# Patient Record
Sex: Male | Born: 1990 | Race: Black or African American | Hispanic: No | Marital: Single | Smoking: Current every day smoker
Health system: Southern US, Community
[De-identification: ages and names within clinical notes are randomized; demographics above are authoritative.]

## PROBLEM LIST (undated history)

## (undated) DIAGNOSIS — F17200 Nicotine dependence, unspecified, uncomplicated: Secondary | ICD-10-CM

## (undated) HISTORY — PX: EYE SURGERY: SHX253

---
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
## 2011-01-10 ENCOUNTER — Ambulatory Visit: Payer: Medicaid Other | Admitting: Family Medicine

## 2011-01-15 ENCOUNTER — Encounter (HOSPITAL_COMMUNITY): Payer: Self-pay | Admitting: Emergency Medicine

## 2011-01-15 ENCOUNTER — Emergency Department (INDEPENDENT_AMBULATORY_CARE_PROVIDER_SITE_OTHER)
Admission: EM | Admit: 2011-01-15 | Discharge: 2011-01-15 | Disposition: A | Discharge status: Home / Self Care | Payer: Self-pay | Source: Home / Self Care | Attending: Family Medicine | Admitting: Family Medicine

## 2011-01-15 DIAGNOSIS — L0231 Cutaneous abscess of buttock: Secondary | ICD-10-CM

## 2011-01-15 DIAGNOSIS — L03317 Cellulitis of buttock: Secondary | ICD-10-CM

## 2011-01-15 LAB — CULTURE, ROUTINE-ABSCESS

## 2011-01-15 MED ORDER — DOXYCYCLINE HYCLATE 100 MG PO CAPS: 100.0000 mg | ORAL_CAPSULE | Freq: Two times a day (BID) | ORAL | Status: AC

## 2011-01-15 MED ORDER — IBUPROFEN 600 MG PO TABS: 600.0000 mg | ORAL_TABLET | Freq: Three times a day (TID) | ORAL | Status: AC | PRN

## 2011-01-15 NOTE — ED Provider Notes (Signed)
History     CSN: [id number]  Arrival date & time 01/15/11  1633   First MD Initiated Contact with Patient 01/15/11 1710      Chief Complaint  Patient presents with  . Recurrent Skin Infections    (Consider location/radiation/quality/duration/timing/severity/associated sxs/prior treatment) HPI Comments: 21 y/o male h/o asthma here c/o "boil" in right buttock for 3 days. Area tender and swollen no spontaneous drainage.   Past Medical History  Diagnosis Date  . Asthma     History reviewed. No pertinent past surgical history.  No family history on file.  History  Substance Use Topics  . Smoking status: Not on file  . Smokeless tobacco: Not on file  . Alcohol Use:       Review of Systems  Constitutional: Negative for fever and chills.  All other systems reviewed and are negative.    Allergies  Review of patient's allergies indicates not on file.  Home Medications   Current Outpatient Rx  Name Route Sig Dispense Refill  . ALBUTEROL SULFATE HFA 108 (90 BASE) MCG/ACT IN AERS Inhalation Inhale into the lungs. 2 puffs q4 prn     . CETIRIZINE HCL 10 MG PO TABS  10 mg. OTC 1 by mouth daily     . DOXYCYCLINE HYCLATE 100 MG PO CAPS Oral Take 1 capsule (100 mg total) by mouth 2 (two) times daily. 20 capsule 0  . FLUTICASONE-SALMETEROL 100-50 MCG/DOSE IN AEPB Inhalation Inhale 1 puff into the lungs 2 (two) times daily.      . IBUPROFEN 600 MG PO TABS Oral Take 1 tablet (600 mg total) by mouth every 8 (eight) hours as needed for pain. 20 tablet 0  . IPRATROPIUM BROMIDE 0.03 % NA SOLN  2 sprays in each nostril three times a day. do not use for more than 4 days       BP 135/90  Pulse 68  Temp(Src) 97.6 F (36.4 C) (Oral)  Resp 16  SpO2 100%  Physical Exam  Nursing note and vitals reviewed. Constitutional: He is oriented to person, place, and time. He appears well-developed and well-nourished. No distress.  Cardiovascular: Normal heart sounds.   Pulmonary/Chest:  Breath sounds normal.  Neurological: He is alert and oriented to person, place, and time.  Skin:       2x3 cm fluctuant tender mass in right lower gluteal area close to mid line. No spontaneous drainage. No significant cellulitis associated.     ED Course  INCISION AND DRAINAGE Date/Time: 01/15/2011 5:20 PM Performed by: Sharin Grave Authorized by: Sharin Grave Consent: Verbal consent obtained. Consent given by: patient Patient understanding: patient states understanding of the procedure being performed Type: abscess Location: right gluteus. Anesthesia: local infiltration Local anesthetic: lidocaine 1% with epinephrine Anesthetic total: 2 ml Scalpel size: 11 Incision type: elliptical Complexity: simple Drainage: purulent Drainage amount: copious Packing material: 1/4 in iodoform gauze Patient tolerance: Patient tolerated the procedure well with no immediate complications.   (including critical care time)   Labs Reviewed  CULTURE, ROUTINE-ABSCESS   No results found.   1. Abscess, gluteal, right       MDM  Treated with I&D plus doxycycline. Wound care instructions and follow up discussed and provided in witting.         Sharin Grave, MD 01/16/11 1159
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
## 2012-01-13 ENCOUNTER — Emergency Department (HOSPITAL_COMMUNITY): Payer: No Typology Code available for payment source

## 2012-01-13 ENCOUNTER — Encounter (HOSPITAL_COMMUNITY): Payer: Self-pay | Admitting: *Deleted

## 2012-01-13 ENCOUNTER — Emergency Department (HOSPITAL_COMMUNITY)
Admission: EM | Admit: 2012-01-13 | Discharge: 2012-01-13 | Disposition: A | Discharge status: Home / Self Care | Payer: No Typology Code available for payment source | Attending: Emergency Medicine | Admitting: Emergency Medicine

## 2012-01-13 DIAGNOSIS — S4980XA Other specified injuries of shoulder and upper arm, unspecified arm, initial encounter: Secondary | ICD-10-CM | POA: Insufficient documentation

## 2012-01-13 DIAGNOSIS — J45909 Unspecified asthma, uncomplicated: Secondary | ICD-10-CM | POA: Insufficient documentation

## 2012-01-13 DIAGNOSIS — S46909A Unspecified injury of unspecified muscle, fascia and tendon at shoulder and upper arm level, unspecified arm, initial encounter: Secondary | ICD-10-CM | POA: Insufficient documentation

## 2012-01-13 DIAGNOSIS — Y92410 Unspecified street and highway as the place of occurrence of the external cause: Secondary | ICD-10-CM | POA: Insufficient documentation

## 2012-01-13 DIAGNOSIS — IMO0002 Reserved for concepts with insufficient information to code with codable children: Secondary | ICD-10-CM | POA: Insufficient documentation

## 2012-01-13 DIAGNOSIS — F17200 Nicotine dependence, unspecified, uncomplicated: Secondary | ICD-10-CM | POA: Insufficient documentation

## 2012-01-13 DIAGNOSIS — Y93I9 Activity, other involving external motion: Secondary | ICD-10-CM | POA: Insufficient documentation

## 2012-01-13 LAB — POCT I-STAT, CHEM 8
Calcium, Ion: 1.27 mmol/L — ABNORMAL HIGH (ref 1.12–1.23)
Creatinine, Ser: 0.9 mg/dL (ref 0.50–1.35)
Glucose, Bld: 90 mg/dL (ref 70–99)
Hemoglobin: 16 g/dL (ref 13.0–17.0)
Potassium: 4 mEq/L (ref 3.5–5.1)
TCO2: 29 mmol/L (ref 0–100)

## 2012-01-13 MED ORDER — MELOXICAM 15 MG PO TABS: 15.0000 mg | ORAL_TABLET | Freq: Every day | ORAL | Status: DC

## 2012-01-13 MED ORDER — IOHEXOL 300 MG/ML  SOLN
80.0000 mL | Freq: Once | INTRAMUSCULAR | Status: AC | PRN
  Administered 2012-01-13: 1 mL via INTRAVENOUS

## 2012-01-13 MED ORDER — METHOCARBAMOL 500 MG PO TABS: 500.0000 mg | ORAL_TABLET | Freq: Two times a day (BID) | ORAL | Status: DC

## 2012-01-13 MED ORDER — ONDANSETRON HCL 4 MG/2ML IJ SOLN
4.0000 mg | Freq: Once | INTRAMUSCULAR | Status: AC
  Administered 2012-01-13: 4 mg via INTRAVENOUS
  Filled 2012-01-13: qty 2

## 2012-01-13 MED ORDER — MORPHINE SULFATE 4 MG/ML IJ SOLN
4.0000 mg | Freq: Once | INTRAMUSCULAR | Status: AC
  Administered 2012-01-13: 4 mg via INTRAVENOUS
  Filled 2012-01-13: qty 1

## 2012-01-13 MED ORDER — OXYCODONE-ACETAMINOPHEN 5-325 MG PO TABS: 2.0000 | ORAL_TABLET | ORAL | Status: DC | PRN
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
## 2013-12-10 ENCOUNTER — Emergency Department (HOSPITAL_COMMUNITY): Payer: Self-pay

## 2013-12-10 ENCOUNTER — Encounter (HOSPITAL_COMMUNITY): Payer: Self-pay | Admitting: Emergency Medicine

## 2013-12-10 ENCOUNTER — Emergency Department (HOSPITAL_COMMUNITY)
Admission: EM | Admit: 2013-12-10 | Discharge: 2013-12-10 | Disposition: A | Discharge status: Home / Self Care | Payer: Self-pay | Attending: Emergency Medicine | Admitting: Emergency Medicine

## 2013-12-10 DIAGNOSIS — S79921A Unspecified injury of right thigh, initial encounter: Secondary | ICD-10-CM | POA: Insufficient documentation

## 2013-12-10 DIAGNOSIS — S199XXA Unspecified injury of neck, initial encounter: Secondary | ICD-10-CM | POA: Insufficient documentation

## 2013-12-10 DIAGNOSIS — Y92410 Unspecified street and highway as the place of occurrence of the external cause: Secondary | ICD-10-CM | POA: Insufficient documentation

## 2013-12-10 DIAGNOSIS — R52 Pain, unspecified: Secondary | ICD-10-CM

## 2013-12-10 DIAGNOSIS — M542 Cervicalgia: Secondary | ICD-10-CM

## 2013-12-10 DIAGNOSIS — J45909 Unspecified asthma, uncomplicated: Secondary | ICD-10-CM | POA: Insufficient documentation

## 2013-12-10 DIAGNOSIS — Y998 Other external cause status: Secondary | ICD-10-CM | POA: Insufficient documentation

## 2013-12-10 DIAGNOSIS — Z720 Tobacco use: Secondary | ICD-10-CM | POA: Insufficient documentation

## 2013-12-10 DIAGNOSIS — R4020 Unspecified coma: Secondary | ICD-10-CM | POA: Insufficient documentation

## 2013-12-10 DIAGNOSIS — M545 Low back pain, unspecified: Secondary | ICD-10-CM

## 2013-12-10 DIAGNOSIS — M79604 Pain in right leg: Secondary | ICD-10-CM

## 2013-12-10 DIAGNOSIS — S3992XA Unspecified injury of lower back, initial encounter: Secondary | ICD-10-CM | POA: Insufficient documentation

## 2013-12-10 DIAGNOSIS — Y9389 Activity, other specified: Secondary | ICD-10-CM | POA: Insufficient documentation

## 2013-12-10 DIAGNOSIS — Z791 Long term (current) use of non-steroidal anti-inflammatories (NSAID): Secondary | ICD-10-CM | POA: Insufficient documentation

## 2013-12-10 MED ORDER — NAPROXEN 500 MG PO TABS: 500.0000 mg | ORAL_TABLET | Freq: Two times a day (BID) | ORAL | Status: DC

## 2013-12-10 MED ORDER — METHOCARBAMOL 500 MG PO TABS: 500.0000 mg | ORAL_TABLET | Freq: Two times a day (BID) | ORAL | Status: DC | PRN

## 2013-12-10 MED ORDER — HYDROCODONE-ACETAMINOPHEN 5-325 MG PO TABS: 1.0000 | ORAL_TABLET | ORAL | Status: DC | PRN

## 2013-12-10 MED ORDER — HYDROMORPHONE HCL 1 MG/ML IJ SOLN
1.0000 mg | Freq: Once | INTRAMUSCULAR | Status: AC
  Administered 2013-12-10: 1 mg via INTRAVENOUS
  Filled 2013-12-10: qty 1
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
## 2013-12-10 NOTE — Discharge Instructions (Signed)
Back Exercises °Back exercises help treat and prevent back injuries. The goal of back exercises is to increase the strength of your abdominal and back muscles and the flexibility of your back. These exercises should be started when you no longer have back pain. Back exercises include: °· Pelvic Tilt. Lie on your back with your knees bent. Tilt your pelvis until the lower part of your back is against the floor. Hold this position 5 to 10 sec and repeat 5 to 10 times. °· Knee to Chest. Pull first 1 knee up against your chest and hold for 20 to 30 seconds, repeat this with the other knee, and then both knees. This may be done with the other leg straight or bent, whichever feels better. °· Sit-Ups or Curl-Ups. Bend your knees 90 degrees. Start with tilting your pelvis, and do a partial, slow sit-up, lifting your trunk only 30 to 45 degrees off the floor. Take at least 2 to 3 seconds for each sit-up. Do not do sit-ups with your knees out straight. If partial sit-ups are difficult, simply do the above but with only tightening your abdominal muscles and holding it as directed. °· Hip-Lift. Lie on your back with your knees flexed 90 degrees. Push down with your feet and shoulders as you raise your hips a couple inches off the floor; hold for 10 seconds, repeat 5 to 10 times. °· Back arches. Lie on your stomach, propping yourself up on bent elbows. Slowly press on your hands, causing an arch in your low back. Repeat 3 to 5 times. Any initial stiffness and discomfort should lessen with repetition over time. °· Shoulder-Lifts. Lie face down with arms beside your body. Keep hips and torso pressed to floor as you slowly lift your head and shoulders off the floor. °Do not overdo your exercises, especially in the beginning. Exercises may cause you some mild back discomfort which lasts for a few minutes; however, if the pain is more severe, or lasts for more than 15 minutes, do not continue exercises until you see your caregiver.  Improvement with exercise therapy for back problems is slow.  °See your caregivers for assistance with developing a proper back exercise program. °Document Released: 01/28/2004 Document Revised: 03/14/2011 Document Reviewed: 10/21/2010 °ExitCare® Patient Information ©2015 [employer]. This information is not intended to replace advice given to you by your health care provider. Make sure you discuss any questions you have with your health care provider. ° °Back Pain, Adult °Low back pain is very common. About 1 in 5 people have back pain. The cause of low back pain is rarely dangerous. The pain often gets better over time. About half of people with a sudden onset of back pain feel better in just 2 weeks. About 8 in 10 people feel better by 6 weeks.  °CAUSES °Some common causes of back pain include: °· Strain of the muscles or ligaments supporting the spine. °· Wear and tear (degeneration) of the spinal discs. °· Arthritis. °· Direct injury to the back. °DIAGNOSIS °Most of the time, the direct cause of low back pain is not known. However, back pain can be treated effectively even when the exact cause of the pain is unknown. Answering your caregiver's questions about your overall health and symptoms is one of the most accurate ways to make sure the cause of your pain is not dangerous. If your caregiver needs more information, he or she may order lab work or imaging tests (X-rays or MRIs). However, even if imaging tests show changes in your   back, this usually does not require surgery. °HOME CARE INSTRUCTIONS °For many people, back pain returns. Since low back pain is rarely dangerous, it is often a condition that people can learn to manage on their own.  °· Remain active. It is stressful on the back to sit or stand in one place. Do not sit, drive, or stand in one place for more than 30 minutes at a time. Take short walks on level surfaces as soon as pain allows. Try to increase the length of time you walk each  day. °· Do not stay in bed. Resting more than 1 or 2 days can delay your recovery. °· Do not avoid exercise or work. Your body is made to move. It is not dangerous to be active, even though your back may hurt. Your back will likely heal faster if you return to being active before your pain is gone. °· Pay attention to your body when you  bend and lift. Many people have less discomfort when lifting if they bend their knees, keep the load close to their bodies, and avoid twisting. Often, the most comfortable positions are those that put less stress on your recovering back. °· Find a comfortable position to sleep. Use a firm mattress and lie on your side with your knees slightly bent. If you lie on your back, put a pillow under your knees. °· Only take over-the-counter or prescription medicines as directed by your caregiver. Over-the-counter medicines to reduce pain and inflammation are often the most helpful. Your caregiver may prescribe muscle relaxant drugs. These medicines help dull your pain so you can more quickly return to your normal activities and healthy exercise. °· Put ice on the injured area. °· Put ice in a plastic bag. °· Place a towel between your skin and the bag. °· Leave the ice on for 15-20 minutes, 03-04 times a day for the first 2 to 3 days. After that, ice and heat may be alternated to reduce pain and spasms. °· Ask your caregiver about trying back exercises and gentle massage. This may be of some benefit. °· Avoid feeling anxious or stressed. Stress increases muscle tension and can worsen back pain. It is important to recognize when you are anxious or stressed and learn ways to manage it. Exercise is a great option. °SEEK MEDICAL CARE IF: °· You have pain that is not relieved with rest or medicine. °· You have pain that does not improve in 1 week. °· You have new symptoms. °· You are generally not feeling well. °SEEK IMMEDIATE MEDICAL CARE IF:  °· You have pain that radiates from your back into  your legs. °· You develop new bowel or bladder control problems. °· You have unusual weakness or numbness in your arms or legs. °· You develop nausea or vomiting. °· You develop abdominal pain. °· You feel faint. °Document Released: 12/20/2004 Document Revised: 06/21/2011 Document Reviewed: 04/23/2013 °ExitCare® Patient Information ©2015 [employer]. This information is not intended to replace advice given to you by your health care provider. Make sure you discuss any questions you have with your health care provider. °Cervical Sprain °A cervical sprain is an injury in the neck in which the strong, fibrous tissues (ligaments) that connect your neck bones stretch or tear. Cervical sprains can range from mild to severe. Severe cervical sprains can cause the neck vertebrae to be unstable. This can lead to damage of the spinal cord and can result in serious nervous system problems. The amount of time it takes for a cervical sprain to get better depends on the cause and extent   of the injury. Most cervical sprains heal in 1 to 3 weeks. °CAUSES  °Severe cervical sprains may be caused by:  °· Contact sport injuries (such as from football, rugby, wrestling, hockey, auto racing, gymnastics, diving, martial arts, or boxing).   °· Motor vehicle collisions.   °· Whiplash injuries. This is an injury from a sudden forward and backward whipping movement of the head and neck.  °· Falls.   °Mild cervical sprains may be caused by:  °· Being in an awkward position, such as while cradling a telephone between your ear and shoulder.   °· Sitting in a chair that does not offer proper support.   °· Working at a poorly designed computer station.   °· Looking up or down for long periods of time.   °SYMPTOMS  °· Pain, soreness, stiffness, or a burning sensation in the front, back, or sides of the neck. This discomfort may develop immediately after the injury or slowly, 24 hours or more after the injury.   °· Pain or tenderness directly in  the middle of the back of the neck.   °· Shoulder or upper back pain.   °· Limited ability to move the neck.   °· Headache.   °· Dizziness.   °· Weakness, numbness, or tingling in the hands or arms.   °· Muscle spasms.   °· Difficulty swallowing or chewing.   °· Tenderness and swelling of the neck.   °DIAGNOSIS  °Most of the time your health care provider can diagnose a cervical sprain by taking your history and doing a physical exam. Your health care provider will ask about previous neck injuries and any known neck problems, such as arthritis in the neck. X-rays may be taken to find out if there are any other problems, such as with the bones of the neck. Other tests, such as a CT scan or MRI, may also be needed.  °TREATMENT  °Treatment depends on the severity of the cervical sprain. Mild sprains can be treated with rest, keeping the neck in place (immobilization), and pain medicines. Severe cervical sprains are immediately immobilized. Further treatment is done to help with pain, muscle spasms, and other symptoms and may include: °· Medicines, such as pain relievers, numbing medicines, or muscle relaxants.   °· Physical therapy. This may involve stretching exercises, strengthening exercises, and posture training. Exercises and improved posture can help stabilize the neck, strengthen muscles, and help stop symptoms from returning.   °HOME CARE INSTRUCTIONS  °· Put ice on the injured area.   °¨ Put ice in a plastic bag.   °¨ Place a towel between your skin and the bag.   °¨ Leave the ice on for 15-20 minutes, 3-4 times a day.   °· If your injury was severe, you may have been given a cervical collar to wear. A cervical collar is a two-piece collar designed to keep your neck from moving while it heals. °¨ Do not remove the collar unless instructed by your health care provider. °¨ If you have long hair, keep it outside of the collar. °¨ Ask your health care provider before making any adjustments to your collar. Minor  adjustments may be required over time to improve comfort and reduce pressure on your chin or on the back of your head. °¨ If you are allowed to remove the collar for cleaning or bathing, follow your health care provider's instructions on how to do so safely. °¨ Keep your collar clean by wiping it with mild soap and water and drying it completely. If the collar you have been given includes removable pads, remove them every 1-2 days and hand   wash them with soap and water. Allow them to air dry. They should be completely dry before you wear them in the collar. °¨ If you are allowed to remove the collar for cleaning and bathing, wash and dry the skin of your neck. Check your skin for irritation or sores. If you see any, tell your health care provider. °¨ Do not drive while wearing the collar.   °· Only take over-the-counter or prescription medicines for pain, discomfort, or fever as directed by your health care provider.   °· Keep all follow-up appointments as directed by your health care provider.   °· Keep all physical therapy appointments as directed by your health care provider.   °· Make any needed adjustments to your workstation to promote good posture.   °· Avoid positions and activities that make your symptoms worse.   °· Warm up and stretch before being active to help prevent problems.   °SEEK MEDICAL CARE IF:  °· Your pain is not controlled with medicine.   °· You are unable to decrease your pain medicine over time as planned.   °· Your activity level is not improving as expected.   °SEEK IMMEDIATE MEDICAL CARE IF:  °· You develop any bleeding. °· You develop stomach upset. °· You have signs of an allergic reaction to your medicine.   °· Your symptoms get worse.   °· You develop new, unexplained symptoms.   °· You have numbness, tingling, weakness, or paralysis in any part of your body.   °MAKE SURE YOU:  °· Understand these instructions. °· Will watch your condition. °· Will get help right away if you are not  doing well or get worse. °Document Released: 10/17/2006 Document Revised: 12/25/2012 Document Reviewed: 06/27/2012 °ExitCare® Patient Information ©2015 [employer]. This information is not intended to replace advice given to you by your health care provider. Make sure you discuss any questions you have with your health care provider. ° ° °Emergency Department Resource Guide °1) Find a Doctor and Pay Out of Pocket °Although you won't have to find out who is covered by your insurance plan, it is a good idea to ask around and get recommendations. You will then need to call the office and see if the doctor you have chosen will accept you as a new patient and what types of options they offer for patients who are self-pay. Some doctors offer discounts or will set up payment plans for their patients who do not have insurance, but you will need to ask so you aren't surprised when you get to your appointment. ° °2) Contact Your Local Health Department °Not all health departments have doctors that can see patients for sick visits, but many do, so it is worth a call to see if yours does. If you don't know where your local health department is, you can check in your phone book. The CDC also has a tool to help you locate your state's health department, and many state websites also have listings of all of their local health departments. ° °3) Find a Walk-in Clinic °If your illness is not likely to be very severe or complicated, you may want to try a walk in clinic. These are popping up all over the country in pharmacies, drugstores, and shopping centers. They're usually staffed by nurse practitioners or physician assistants that have been trained to treat common illnesses and complaints. They're usually fairly quick and inexpensive. However, if you have serious medical issues or chronic medical problems, these are probably not your best option. ° °No Primary Care Doctor: °- Call Health   Connect at  [phone] - they can help you  locate a primary care doctor that  accepts your insurance, provides certain services, etc. °- Physician Referral Service- 1-800-533-3463 ° °Chronic Pain Problems: °Organization         Address  Phone   Notes  °[facility]  ([phone]) [phone] Patients need to be referred by their primary care doctor.  ° °Medication Assistance: °Organization         Address  Phone   Notes  °[place] [facility] [address] °[place], [place] [address] (336) 641-8030 --Must be a resident of [place] °-- Must have NO insurance coverage whatsoever (no Medicaid/ Medicare, etc.) °-- The pt. MUST have a primary care doctor that directs their care regularly and follows them in the community °  °[facility]  (866) 331-1348   °[employer]  (888) 892-1162   ° °Agencies that provide inexpensive medical care: °Organization         Address  Phone   Notes  °[facility]  (336) 832-8035   °[facility]    (336) 832-7272   °[facility] [address] °[place], [place] [address] (336) 832-4777   °[facility] [address], °[place] (336) 271-4999   °[facility]    (336) 373-0678   °[facility]    (336) 272-1050   °[facility] ° [address], [place] Phone:  (336) 832-4444, Fax:  (336) [phone] Hours of Operation:  9 am - 6 pm, M-F.  Also accepts Medicaid/Medicare and self-pay.  °[facility] ° [address], [place] Phone: (336) [phone], Fax: (336) [phone]. Hours of Operation:  8:30 am - 5:30 pm, M-F.  Also accepts Medicaid and self-pay.  °[facility] [place] [address], [place] Phone: (336) 878-6027   °[facility] [address], [place], [place] (336)723-1848, Ext. 123 Mondays & Thursdays: 7-9 AM.  First 15 patients are seen on a first come, first serve basis. °  ° °Medicaid-accepting [place]  Providers: ° °Organization         Address  Phone   Notes  °[facility] [address], [place] (336) [phone] Also accepts self-pay patients.  °[facility] [address], [place] ° (336) [phone]   °[facility] [address], [place] (336) 288-8857   °[facility] [address] [place] [address], [place] (336) 299-7000   °Veita Bland [address], [place]  ° (336) [phone] Only accepts [place] Access Medicaid patients after they have their name applied to their card.  ° °Self-Pay (no insurance) in [place]: ° °Organization         Address  Phone   Notes  °Sickle Cell Patients, [facility] [address], [place] (336) 832-1970   °[facility] [address], [place] (336) 832-4400   °[facility] ° [address], [place] (336) [phone]   °[facility]/Dr. Osei-Bonsu ° [address] [place] [address], [place] or [address], [place] (336) [phone] Phone number for both [place] and [place] locations is the same.  °[facility] [address], [place] (336) [phone]   °[facility] [address] [place] [address], [place] or [address] (336) 852-7530 °(336) 878-2260   °[facility] [address], [place] (336) 350-1642, phone; (336) 294-5005, fax Sees patients 1st and 3rd Saturday of every month.  Must not qualify for public or private insurance (i.e. Medicaid, Medicare, [place] Health Choice, Veterans' Benefits) • Household income should be no more than 200% of the poverty level •The clinic cannot treat you if you are pregnant or think you are pregnant • Sexually transmitted diseases are not treated at the clinic.  ° ° °Dental Care: °Organization         Address  Phone  Notes  °[facility] [address], [place] (336) 641-6152 Accepts children up to age 21 who are enrolled in Medicaid or [place] Health Choice; pregnant women with a Medicaid card; and children who have applied for Medicaid or [place] Health Choice, but were declined, whose parents can pay a reduced fee at time of service.  °[facility] [place]  [address], [place] (336) 641-7733 Accepts children up to age 21 who are enrolled in Medicaid or [place] Health Choice; pregnant women with a Medicaid card; and children who have applied for Medicaid or [place] Health Choice, but were declined, whose parents can pay a reduced fee at time of service.  °[facility] ° [address], [place] (336) [phone] Patients are seen by appointment only. Walk-ins are not accepted. [facility] will see patients 18 years of age and older. °Monday - Tuesday (8am-5pm) °Most Wednesdays (8:30-5pm) °$30 per visit, cash only  °[facility] ° [address], [place] (336) [phone] Patients are seen by appointment only. Walk-ins are not accepted. [facility] will see patients 18 years of age and older. °One Wednesday Evening (Monthly: Volunteer Based).  $30 per visit, cash only  °[facility]  ([phone]) [phone] for adults; Children under age 4, call Graduate Pediatric Dentistry at ([phone]) [phone]. Children aged 4-14, please call ([phone]) [phone] to request a pediatric application. ° Dental services are provided in all areas of dental care including fillings, crowns and bridges, complete and partial dentures, implants, gum treatment, root canals, and extractions. Preventive care is also provided. Treatment is provided to both adults and children. °Patients are selected via a lottery and there is often a waiting list. °  °[facility] [address], °[place] ° (336) 763-8833 [email or web address] °  °[facility]  [address], [place], [place] (336)723-1848, Ext. 123 Second and Fourth Thursday of each month, opens at 6:30 AM; Clinic ends at 9 AM.  Patients are seen on a first-come first-served basis, and a limited number are seen during each clinic.  ° °[facility] ° [address], [place], [place] (336) [phone]   Eligibility Requirements °You must have lived in [place], [place], or [place] counties for at least the last three months. °  You cannot be eligible for state or federal sponsored healthcare insurance, including Veterans Administration, Medicaid, or Medicare. °  You generally cannot be eligible for healthcare insurance through your employer.  °  How to apply: °Eligibility screenings are held every Tuesday and Wednesday afternoon from 1:00 pm until 4:00 pm. You do not need an appointment for the interview!  °[facility] [address], [place],  336-631-2330   °[facility]    336-342-8273   °[facility]  336-703-3100   °[facility]  336-570-6415   ° °Behavioral Health Resources in the Community: °Intensive Outpatient Programs °Organization         Address  Phone  Notes  °[facility] [address], [place], [place] 336-878-6098   °[facility] [address], [place], [place] 336-832-9800   °[facility] [address], [place], [place] ° 336-882-2125   °[facility] [address],  °[place], [place] 1-800-853-5163 or 336-641-4981   °Substance Abuse Resources °Organization         Address  Phone  Notes  °[facility]  336-882-2125   °[facility]  336-784-9470   °[facility]  336-285-9073   °[facility]  336-845-3988   °Residential & Outpatient Substance Abuse Program  1-800-659-3381   °Psychological Services °Organization         Address  Phone  Notes  °[facility]  336- 832-9600     °[facility]  336- 378-7881   °[facility] [address], [place] 1-800-853-5163 or 336-641-4981   ° °Mobile Crisis Teams °Organization         Address  Phone  Notes  °[facility], Mobile Crisis Care Unit  1-877-626-1772   °Assertive °[facility] ° [address] [place], [place] 336-834-9664   °Sharon DeEsch [address] °[place] 336-554-5454   ° °Self-Help/Support Groups °Organization         Address  Phone             Notes  °[facility] - variety of support groups  336- 373-1402 Call for more information  °Narcotics Anonymous (NA), [facility] [address], °[place]  2 meetings at this location  ° °Residential Treatment Programs °Organization         Address  Phone  Notes  °[facility] [address],    °[place]  1-866-801-8205   °[facility] ° [address], [place], [place] 704-293-8524   °[facility] [address], [place] 336-845-3988 Admissions: 8am-3pm M-F  °[facility] [address]    °[place], [place] 336-841-1104   °[facility] [address], [place], [place] 336-379-7146   °[facility] [address]  °[place], [place] 336-285-9073   °[facility] - Intensive Outpatient [address], [place], [place] 336-852-3033   °[facility] [address]  °[place], [place] 1-877-615-2722 or 336-784-9470   °[facility] [address] [place], [place] 336-227-7417 Accepts Medicaid  °[facility] [address]  ° [place] 1-800-659-3381 Substance Abuse/Addiction Treatment  ° °[place] [facility] Resources °Organization         Address  Phone  Notes  °[facility]  (888) 581-9988   °Julie Brannon, PhD [address] [place], [place]   (336) 349-5553 or (336) 951-0000    °[facility]   [address] °[place], [place] (336) 349-4454   °[facility] [address], [place], [place] (336) 342-8316 Insurance/Medicaid/sponsorship through [facility]  °[facility] [address]                                      [place], [place] (336) [phone] Therapy/tele-psych/case  °[facility] [address]  ° [place], [place] (336) [phone]    °Dr. Arfeen  [phone]   °[facility]  [employer] [facility] 1) [address], [place] °2) [address], [place] °3)  [address], [place] [phone] °[phone] ° °[phone]   °[place] Child Abuse Hotline [phone] or [phone] (After Hours)    ° ° ° ° °

## 2013-12-10 NOTE — ED Notes (Signed)
Patient transported to X-ray 
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
## 2015-09-18 ENCOUNTER — Encounter (HOSPITAL_COMMUNITY): Payer: Self-pay | Admitting: Emergency Medicine

## 2015-09-18 ENCOUNTER — Emergency Department (HOSPITAL_COMMUNITY): Payer: Self-pay

## 2015-09-18 ENCOUNTER — Emergency Department (HOSPITAL_COMMUNITY)
Admission: EM | Admit: 2015-09-18 | Discharge: 2015-09-18 | Disposition: A | Discharge status: Home / Self Care | Payer: Self-pay | Attending: Emergency Medicine | Admitting: Emergency Medicine

## 2015-09-18 DIAGNOSIS — Y999 Unspecified external cause status: Secondary | ICD-10-CM | POA: Insufficient documentation

## 2015-09-18 DIAGNOSIS — S01111A Laceration without foreign body of right eyelid and periocular area, initial encounter: Secondary | ICD-10-CM | POA: Insufficient documentation

## 2015-09-18 DIAGNOSIS — Y92481 Parking lot as the place of occurrence of the external cause: Secondary | ICD-10-CM | POA: Insufficient documentation

## 2015-09-18 DIAGNOSIS — F17210 Nicotine dependence, cigarettes, uncomplicated: Secondary | ICD-10-CM | POA: Insufficient documentation

## 2015-09-18 DIAGNOSIS — Y939 Activity, unspecified: Secondary | ICD-10-CM | POA: Insufficient documentation

## 2015-09-18 DIAGNOSIS — J45909 Unspecified asthma, uncomplicated: Secondary | ICD-10-CM | POA: Insufficient documentation

## 2015-09-18 DIAGNOSIS — S0990XA Unspecified injury of head, initial encounter: Secondary | ICD-10-CM | POA: Insufficient documentation

## 2015-09-18 DIAGNOSIS — H539 Unspecified visual disturbance: Secondary | ICD-10-CM

## 2015-09-18 DIAGNOSIS — S0083XA Contusion of other part of head, initial encounter: Secondary | ICD-10-CM

## 2015-09-18 DIAGNOSIS — W228XXA Striking against or struck by other objects, initial encounter: Secondary | ICD-10-CM | POA: Insufficient documentation

## 2015-09-18 DIAGNOSIS — S0181XA Laceration without foreign body of other part of head, initial encounter: Secondary | ICD-10-CM | POA: Insufficient documentation

## 2015-09-18 LAB — BASIC METABOLIC PANEL
Anion gap: 8 (ref 5–15)
BUN: 12 mg/dL (ref 6–20)
CALCIUM: 9.4 mg/dL (ref 8.9–10.3)
CHLORIDE: 107 mmol/L (ref 101–111)
CO2: 25 mmol/L (ref 22–32)
CREATININE: 1.21 mg/dL (ref 0.61–1.24)
GFR calc Af Amer: >60 mL/min (ref >60)
GFR calc non Af Amer: >60 mL/min (ref >60)
GLUCOSE: 90 mg/dL (ref 65–99)
Potassium: 3.6 mmol/L (ref 3.5–5.1)
Sodium: 140 mmol/L (ref 135–145)

## 2015-09-18 LAB — CBC
HCT: 44.3 % (ref 39.0–52.0)
HEMOGLOBIN: 14.8 g/dL (ref 13.0–17.0)
MCH: 30.6 pg (ref 26.0–34.0)
MCHC: 33.4 g/dL (ref 30.0–36.0)
MCV: 91.7 fL (ref 78.0–100.0)
PLATELETS: 291 10*3/uL (ref 150–400)
RBC: 4.83 MIL/uL (ref 4.22–5.81)
RDW: 12.9 % (ref 11.5–15.5)
WBC: 15.4 10*3/uL — ABNORMAL HIGH (ref 4.0–10.5)

## 2015-09-18 MED ORDER — ERYTHROMYCIN 5 MG/GM OP OINT: TOPICAL_OINTMENT | OPHTHALMIC | 1 refills | Status: DC

## 2015-09-18 MED ORDER — FLUORESCEIN SODIUM 1 MG OP STRP
1.0000 | ORAL_STRIP | Freq: Once | OPHTHALMIC | Status: AC
  Administered 2015-09-18: 1 via OPHTHALMIC
  Filled 2015-09-18: qty 1

## 2015-09-18 MED ORDER — ERYTHROMYCIN 5 MG/GM OP OINT
1.0000 "application " | TOPICAL_OINTMENT | Freq: Once | OPHTHALMIC | Status: AC
  Administered 2015-09-18: 1 via OPHTHALMIC
  Filled 2015-09-18: qty 3.5

## 2015-09-18 MED ORDER — FENTANYL CITRATE (PF) 100 MCG/2ML IJ SOLN
75.0000 ug | Freq: Once | INTRAMUSCULAR | Status: AC
  Administered 2015-09-18: 75 ug via INTRAVENOUS
  Filled 2015-09-18: qty 2

## 2015-09-18 MED ORDER — LIDOCAINE HCL (PF) 1 % IJ SOLN
INTRAMUSCULAR | Status: AC
  Filled 2015-09-18: qty 5

## 2015-09-18 MED ORDER — TETRACAINE HCL 0.5 % OP SOLN
2.0000 [drp] | Freq: Once | OPHTHALMIC | Status: AC
  Administered 2015-09-18: 2 [drp] via OPHTHALMIC
  Filled 2015-09-18: qty 2

## 2015-09-18 MED ORDER — LIDOCAINE-EPINEPHRINE (PF) 2 %-1:200000 IJ SOLN
10.0000 mL | Freq: Once | INTRAMUSCULAR | Status: AC
  Administered 2015-09-18: 10 mL
  Filled 2015-09-18: qty 20

## 2015-09-18 MED ORDER — LIDOCAINE HCL (PF) 1 % IJ SOLN
5.0000 mL | Freq: Once | INTRAMUSCULAR | Status: AC
  Administered 2015-09-18: 5 mL

## 2015-09-18 MED ORDER — LIDOCAINE HCL (PF) 1 % IJ SOLN
5.0000 mL | Freq: Once | INTRAMUSCULAR | Status: AC
  Administered 2015-09-18: 5 mL via INTRADERMAL
  Filled 2015-09-18: qty 5

## 2015-09-18 NOTE — ED Notes (Signed)
Dr. Groat at bedside.  
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
## 2015-09-18 NOTE — ED Notes (Signed)
Dr. Rhunette CroftNanavati and Sharia ReeveJosh PA at bedside

## 2015-09-18 NOTE — ED Notes (Signed)
C-collar removed per Dr. Rhunette CroftNanavati.
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
## 2015-09-18 NOTE — ED Notes (Signed)
[facility] [place]
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
## 2015-09-18 NOTE — ED Provider Notes (Signed)
LACERATION REPAIR Performed by: Carolee RotaGEIPLE,Jodie Cavey S Authorized by: Carolee RotaGEIPLE,Tomasita Beevers S Consent: Verbal consent obtained. Risks and benefits: risks, benefits and alternatives were discussed Consent given by: patient Patient identity confirmed: provided demographic data Prepped and Draped in normal sterile fashion Wound explored  Laceration Location: R orbit  Laceration Length: 3cm  No Foreign Bodies seen or palpated  Anesthesia: local infiltration  Local anesthetic: lidocaine 1% without epinephrine  Anesthetic total: 3 ml  Irrigation method: skin scrub with dermal cleanser Amount of cleaning: standard  Skin closure: 6-0 Ethilon  Number of sutures: 9  Technique: simple interrupted  Patient tolerance: Patient tolerated the procedure well with no immediate complications.    Alexander CriglerJoshua Dawid Dupriest, PA-C 09/18/15 [id number]    Derwood KaplanAnkit Nanavati, MD 09/18/15 [id number]

## 2017-01-03 ENCOUNTER — Inpatient Hospital Stay (HOSPITAL_COMMUNITY): Payer: Self-pay

## 2017-01-03 ENCOUNTER — Emergency Department (HOSPITAL_COMMUNITY): Payer: Self-pay

## 2017-01-03 ENCOUNTER — Encounter (HOSPITAL_COMMUNITY): Payer: Self-pay | Admitting: Emergency Medicine

## 2017-01-03 ENCOUNTER — Inpatient Hospital Stay (HOSPITAL_COMMUNITY)
Admission: EM | Admit: 2017-01-03 | Discharge: 2017-01-14 | DRG: 012 | Disposition: A | Discharge status: Home / Self Care | Payer: Self-pay | Attending: General Surgery | Admitting: General Surgery

## 2017-01-03 DIAGNOSIS — R739 Hyperglycemia, unspecified: Secondary | ICD-10-CM | POA: Diagnosis not present

## 2017-01-03 DIAGNOSIS — S1193XA Puncture wound without foreign body of unspecified part of neck, initial encounter: Secondary | ICD-10-CM

## 2017-01-03 DIAGNOSIS — S1124XA Puncture wound with foreign body of pharynx and cervical esophagus, initial encounter: Principal | ICD-10-CM | POA: Diagnosis present

## 2017-01-03 DIAGNOSIS — F1410 Cocaine abuse, uncomplicated: Secondary | ICD-10-CM | POA: Diagnosis present

## 2017-01-03 DIAGNOSIS — Z01818 Encounter for other preprocedural examination: Secondary | ICD-10-CM

## 2017-01-03 DIAGNOSIS — E46 Unspecified protein-calorie malnutrition: Secondary | ICD-10-CM | POA: Diagnosis present

## 2017-01-03 DIAGNOSIS — K117 Disturbances of salivary secretion: Secondary | ICD-10-CM

## 2017-01-03 DIAGNOSIS — W3400XA Accidental discharge from unspecified firearms or gun, initial encounter: Secondary | ICD-10-CM

## 2017-01-03 DIAGNOSIS — F1020 Alcohol dependence, uncomplicated: Secondary | ICD-10-CM | POA: Diagnosis present

## 2017-01-03 DIAGNOSIS — S41032A Puncture wound without foreign body of left shoulder, initial encounter: Secondary | ICD-10-CM | POA: Diagnosis present

## 2017-01-03 DIAGNOSIS — R402362 Coma scale, best motor response, obeys commands, at arrival to emergency department: Secondary | ICD-10-CM | POA: Diagnosis present

## 2017-01-03 DIAGNOSIS — R402142 Coma scale, eyes open, spontaneous, at arrival to emergency department: Secondary | ICD-10-CM | POA: Diagnosis present

## 2017-01-03 DIAGNOSIS — S1183XA Puncture wound without foreign body of other specified part of neck, initial encounter: Secondary | ICD-10-CM

## 2017-01-03 DIAGNOSIS — Z23 Encounter for immunization: Secondary | ICD-10-CM

## 2017-01-03 DIAGNOSIS — Z681 Body mass index (BMI) 19 or less, adult: Secondary | ICD-10-CM

## 2017-01-03 DIAGNOSIS — R402252 Coma scale, best verbal response, oriented, at arrival to emergency department: Secondary | ICD-10-CM | POA: Diagnosis present

## 2017-01-03 DIAGNOSIS — T797XXA Traumatic subcutaneous emphysema, initial encounter: Secondary | ICD-10-CM | POA: Diagnosis present

## 2017-01-03 DIAGNOSIS — E876 Hypokalemia: Secondary | ICD-10-CM | POA: Diagnosis not present

## 2017-01-03 HISTORY — DX: Nicotine dependence, unspecified, uncomplicated: F17.200

## 2017-01-03 LAB — I-STAT CHEM 8, ED
BUN: 16 mg/dL (ref 6–20)
CHLORIDE: 104 mmol/L (ref 101–111)
CREATININE: 1.4 mg/dL — AB (ref 0.61–1.24)
Calcium, Ion: 1.13 mmol/L — ABNORMAL LOW (ref 1.15–1.40)
Glucose, Bld: 112 mg/dL — ABNORMAL HIGH (ref 65–99)
HEMATOCRIT: 42 % (ref 39.0–52.0)
Hemoglobin: 14.3 g/dL (ref 13.0–17.0)
Potassium: 2.7 mmol/L — CL (ref 3.5–5.1)
Sodium: 142 mmol/L (ref 135–145)
TCO2: 24 mmol/L (ref 22–32)

## 2017-01-03 LAB — URINALYSIS, ROUTINE W REFLEX MICROSCOPIC
BILIRUBIN URINE: NEGATIVE
Glucose, UA: NEGATIVE mg/dL
HGB URINE DIPSTICK: NEGATIVE
KETONES UR: NEGATIVE mg/dL
Leukocytes, UA: NEGATIVE
NITRITE: NEGATIVE
Protein, ur: NEGATIVE mg/dL
SPECIFIC GRAVITY, URINE: 1.039 — AB (ref 1.005–1.030)
pH: 6 (ref 5.0–8.0)

## 2017-01-03 LAB — COMPREHENSIVE METABOLIC PANEL
ALBUMIN: 4 g/dL (ref 3.5–5.0)
ALK PHOS: 53 U/L (ref 38–126)
ALT: 20 U/L (ref 17–63)
AST: 26 U/L (ref 15–41)
Anion gap: 13 (ref 5–15)
BILIRUBIN TOTAL: 1.3 mg/dL — AB (ref 0.3–1.2)
BUN: 15 mg/dL (ref 6–20)
CALCIUM: 9.1 mg/dL (ref 8.9–10.3)
CO2: 24 mmol/L (ref 22–32)
CREATININE: 1.49 mg/dL — AB (ref 0.61–1.24)
Chloride: 101 mmol/L (ref 101–111)
GFR calc Af Amer: >60 mL/min (ref >60)
Glucose, Bld: 118 mg/dL — ABNORMAL HIGH (ref 65–99)
POTASSIUM: 2.7 mmol/L — AB (ref 3.5–5.1)
Sodium: 138 mmol/L (ref 135–145)
TOTAL PROTEIN: 7.6 g/dL (ref 6.5–8.1)

## 2017-01-03 LAB — CBC
HEMATOCRIT: 41.1 % (ref 39.0–52.0)
Hemoglobin: 13.9 g/dL (ref 13.0–17.0)
MCH: 31.3 pg (ref 26.0–34.0)
MCHC: 33.8 g/dL (ref 30.0–36.0)
MCV: 92.6 fL (ref 78.0–100.0)
PLATELETS: 401 10*3/uL — AB (ref 150–400)
RBC: 4.44 MIL/uL (ref 4.22–5.81)
RDW: 12.6 % (ref 11.5–15.5)
WBC: 9.4 10*3/uL (ref 4.0–10.5)

## 2017-01-03 LAB — BPAM RBC
BLOOD PRODUCT EXPIRATION DATE: 201901092359
Blood Product Expiration Date: 201901102359
ISSUE DATE / TIME: 201901011714
ISSUE DATE / TIME: 201901011714
UNIT TYPE AND RH: 9500
Unit Type and Rh: 9500

## 2017-01-03 LAB — PREPARE FRESH FROZEN PLASMA
UNIT DIVISION: 0
Unit division: 0

## 2017-01-03 LAB — TYPE AND SCREEN
ABO/RH(D): A POS
Antibody Screen: NEGATIVE
UNIT DIVISION: 0
UNIT DIVISION: 0

## 2017-01-03 LAB — I-STAT ARTERIAL BLOOD GAS, ED
ACID-BASE EXCESS: 1 mmol/L (ref 0.0–2.0)
Bicarbonate: 24.7 mmol/L (ref 20.0–28.0)
O2 SAT: 100 %
PCO2 ART: 36.9 mmHg (ref 32.0–48.0)
TCO2: 26 mmol/L (ref 22–32)
pH, Arterial: 7.434 (ref 7.350–7.450)
pO2, Arterial: 473 mmHg — ABNORMAL HIGH (ref 83.0–108.0)

## 2017-01-03 LAB — BPAM FFP
BLOOD PRODUCT EXPIRATION DATE: 201901012359
Blood Product Expiration Date: 201901012359
ISSUE DATE / TIME: 201901011715
ISSUE DATE / TIME: 201901011715
UNIT TYPE AND RH: 6200
UNIT TYPE AND RH: 6200

## 2017-01-03 LAB — PROTIME-INR
INR: 1.13
Prothrombin Time: 14.4 seconds (ref 11.4–15.2)

## 2017-01-03 LAB — I-STAT CG4 LACTIC ACID, ED: LACTIC ACID, VENOUS: 2.81 mmol/L — AB (ref 0.5–1.9)

## 2017-01-03 LAB — ABO/RH: ABO/RH(D): A POS

## 2017-01-03 LAB — CDS SEROLOGY

## 2017-01-03 LAB — POTASSIUM: Potassium: 3.6 mmol/L (ref 3.5–5.1)

## 2017-01-03 LAB — TRIGLYCERIDES: Triglycerides: 249 mg/dL — ABNORMAL HIGH (ref <150)

## 2017-01-03 LAB — ETHANOL

## 2017-01-03 MED ORDER — POTASSIUM CHLORIDE IN NACL 20-0.9 MEQ/L-% IV SOLN
INTRAVENOUS | Status: DC
  Administered 2017-01-03 – 2017-01-05 (×2): via INTRAVENOUS
  Filled 2017-01-03 (×3): qty 1000

## 2017-01-03 MED ORDER — PANTOPRAZOLE SODIUM 40 MG IV SOLR
40.0000 mg | Freq: Every day | INTRAVENOUS | Status: DC
  Administered 2017-01-03 – 2017-01-13 (×11): 40 mg via INTRAVENOUS
  Filled 2017-01-03 (×11): qty 40

## 2017-01-03 MED ORDER — ACETAMINOPHEN 325 MG PO TABS: 650.0000 mg | ORAL_TABLET | Freq: Four times a day (QID) | ORAL | Status: DC | PRN

## 2017-01-03 MED ORDER — ETOMIDATE 2 MG/ML IV SOLN
INTRAVENOUS | Status: AC | PRN
  Administered 2017-01-03: 20 mg via INTRAVENOUS

## 2017-01-03 MED ORDER — FENTANYL 2500MCG IN NS 250ML (10MCG/ML) PREMIX INFUSION
25.0000 ug/h | INTRAVENOUS | Status: DC
  Administered 2017-01-03: 50 ug/h via INTRAVENOUS
  Administered 2017-01-04: 150 ug/h via INTRAVENOUS
  Filled 2017-01-03 (×2): qty 250

## 2017-01-03 MED ORDER — PROPOFOL 1000 MG/100ML IV EMUL
0.0000 ug/kg/min | INTRAVENOUS | Status: DC
  Administered 2017-01-03 – 2017-01-04 (×2): 50 ug/kg/min via INTRAVENOUS
  Administered 2017-01-04: 35 ug/kg/min via INTRAVENOUS
  Filled 2017-01-03 (×3): qty 100

## 2017-01-03 MED ORDER — CHLORHEXIDINE GLUCONATE 0.12% ORAL RINSE (MEDLINE KIT)
15.0000 mL | Freq: Two times a day (BID) | OROMUCOSAL | Status: DC
  Administered 2017-01-03 – 2017-01-04 (×2): 15 mL via OROMUCOSAL

## 2017-01-03 MED ORDER — DEXTROSE 5 % IV SOLN
INTRAVENOUS | Status: AC | PRN
  Administered 2017-01-03: 1000 mg via INTRAVENOUS

## 2017-01-03 MED ORDER — MIDAZOLAM HCL 2 MG/2ML IJ SOLN
INTRAMUSCULAR | Status: AC
  Filled 2017-01-03: qty 6

## 2017-01-03 MED ORDER — METOPROLOL TARTRATE 5 MG/5ML IV SOLN: 5.0000 mg | Freq: Four times a day (QID) | INTRAVENOUS | Status: DC | PRN

## 2017-01-03 MED ORDER — PROPOFOL 1000 MG/100ML IV EMUL
0.0000 ug/kg/min | INTRAVENOUS | Status: DC
  Administered 2017-01-03: 80 ug/kg/min via INTRAVENOUS

## 2017-01-03 MED ORDER — FENTANYL CITRATE (PF) 100 MCG/2ML IJ SOLN
50.0000 ug | Freq: Once | INTRAMUSCULAR | Status: AC
  Administered 2017-01-03: 50 ug via INTRAVENOUS

## 2017-01-03 MED ORDER — CHLORHEXIDINE GLUCONATE 0.12% ORAL RINSE (MEDLINE KIT): 15.0000 mL | Freq: Two times a day (BID) | OROMUCOSAL | Status: DC

## 2017-01-03 MED ORDER — MIDAZOLAM HCL 5 MG/5ML IJ SOLN
INTRAMUSCULAR | Status: AC | PRN
  Administered 2017-01-03: 2 mg via INTRAVENOUS

## 2017-01-03 MED ORDER — ORAL CARE MOUTH RINSE: 15.0000 mL | Freq: Four times a day (QID) | OROMUCOSAL | Status: DC

## 2017-01-03 MED ORDER — ONDANSETRON HCL 4 MG/2ML IJ SOLN
4.0000 mg | Freq: Four times a day (QID) | INTRAMUSCULAR | Status: DC | PRN
Start: 2017-01-03 — End: 2017-01-14
  Administered 2017-01-04 – 2017-01-11 (×4): 4 mg via INTRAVENOUS
  Filled 2017-01-03 (×3): qty 2

## 2017-01-03 MED ORDER — MIDAZOLAM HCL 2 MG/2ML IJ SOLN
INTRAMUSCULAR | Status: AC
  Filled 2017-01-03: qty 2

## 2017-01-03 MED ORDER — ORAL CARE MOUTH RINSE
15.0000 mL | OROMUCOSAL | Status: DC
  Administered 2017-01-03 – 2017-01-04 (×9): 15 mL via OROMUCOSAL

## 2017-01-03 MED ORDER — FENTANYL CITRATE (PF) 100 MCG/2ML IJ SOLN
INTRAMUSCULAR | Status: AC
  Administered 2017-01-03: 18:00:00
  Filled 2017-01-03: qty 2

## 2017-01-03 MED ORDER — IOPAMIDOL (ISOVUE-370) INJECTION 76%
INTRAVENOUS | Status: AC
  Administered 2017-01-03: 50 mL
  Filled 2017-01-03: qty 50

## 2017-01-03 MED ORDER — ACETAMINOPHEN 160 MG/5ML PO SOLN
650.0000 mg | Freq: Four times a day (QID) | ORAL | Status: DC | PRN
  Administered 2017-01-04: 650 mg
  Filled 2017-01-03: qty 20.3

## 2017-01-03 MED ORDER — SODIUM CHLORIDE 0.9 % IV SOLN
INTRAVENOUS | Status: AC | PRN
  Administered 2017-01-03: 1000 mL via INTRAVENOUS

## 2017-01-03 MED ORDER — FENTANYL CITRATE (PF) 100 MCG/2ML IJ SOLN
INTRAMUSCULAR | Status: AC
  Administered 2017-01-03: 75 ug
  Filled 2017-01-03: qty 2

## 2017-01-03 MED ORDER — CEFAZOLIN SODIUM-DEXTROSE 1-4 GM/50ML-% IV SOLN
1.0000 g | Freq: Once | INTRAVENOUS | Status: DC
  Filled 2017-01-03: qty 50

## 2017-01-03 MED ORDER — PANTOPRAZOLE SODIUM 40 MG PO TBEC
40.0000 mg | DELAYED_RELEASE_TABLET | Freq: Every day | ORAL | Status: DC
  Administered 2017-01-14: 40 mg via ORAL
  Filled 2017-01-03: qty 1

## 2017-01-03 MED ORDER — ONDANSETRON 4 MG PO TBDP: 4.0000 mg | ORAL_TABLET | Freq: Four times a day (QID) | ORAL | Status: DC | PRN

## 2017-01-03 MED ORDER — SUCCINYLCHOLINE CHLORIDE 20 MG/ML IJ SOLN
INTRAMUSCULAR | Status: AC | PRN
  Administered 2017-01-03: 100 mg via INTRAVENOUS

## 2017-01-03 MED ORDER — FENTANYL BOLUS VIA INFUSION
50.0000 ug | INTRAVENOUS | Status: DC | PRN
  Filled 2017-01-03: qty 50

## 2017-01-03 MED ORDER — PROPOFOL 1000 MG/100ML IV EMUL
INTRAVENOUS | Status: AC
Start: 2017-01-03 — End: 2017-01-04
  Filled 2017-01-03: qty 100

## 2017-01-03 MED ORDER — ROCURONIUM BROMIDE 50 MG/5ML IV SOLN
INTRAVENOUS | Status: AC | PRN
  Administered 2017-01-03: 100 mg via INTRAVENOUS

## 2017-01-03 MED ORDER — FENTANYL CITRATE (PF) 100 MCG/2ML IJ SOLN
INTRAMUSCULAR | Status: AC
  Filled 2017-01-03: qty 2

## 2017-01-03 MED ORDER — TETANUS-DIPHTH-ACELL PERTUSSIS 5-2.5-18.5 LF-MCG/0.5 IM SUSP
0.5000 mL | Freq: Once | INTRAMUSCULAR | Status: AC
  Administered 2017-01-03: 0.5 mL via INTRAMUSCULAR
  Filled 2017-01-03: qty 0.5

## 2017-01-03 MED ORDER — PROPOFOL 1000 MG/100ML IV EMUL
INTRAVENOUS | Status: AC
  Administered 2017-01-03: 18:00:00
  Filled 2017-01-03: qty 100

## 2017-01-03 NOTE — ED Notes (Signed)
I stat lactic acid and I stat chem 8 reported to Dr. Hyacinth MeekerMiller at bedside.
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
## 2017-01-03 NOTE — Progress Notes (Signed)
Orthopedic Tech Progress Note Patient Details:  Alexander Yates 01/03/1874 [id number] Level 1 trauma ortho visit. [name], male   DOB: 01/03/1874, [age] y.o.   MRN: [id number]   Alexander Yates, Alexander Yates 01/03/2017, 5:23 PM
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
## 2017-01-04 ENCOUNTER — Inpatient Hospital Stay (HOSPITAL_COMMUNITY): Payer: Self-pay

## 2017-01-04 ENCOUNTER — Inpatient Hospital Stay (HOSPITAL_COMMUNITY): Payer: Self-pay | Admitting: Certified Registered Nurse Anesthetist

## 2017-01-04 ENCOUNTER — Encounter (HOSPITAL_COMMUNITY): Admission: EM | Disposition: A | Discharge status: Home / Self Care | Payer: Self-pay | Source: Home / Self Care

## 2017-01-04 ENCOUNTER — Encounter (HOSPITAL_COMMUNITY): Payer: Self-pay | Admitting: Certified Registered Nurse Anesthetist

## 2017-01-04 HISTORY — PX: TRACHEOSTOMY TUBE PLACEMENT: SHX814

## 2017-01-04 HISTORY — PX: DIRECT LARYNGOSCOPY: SHX5326

## 2017-01-04 LAB — HIV ANTIBODY (ROUTINE TESTING W REFLEX): HIV SCREEN 4TH GENERATION: NONREACTIVE

## 2017-01-04 LAB — COMPREHENSIVE METABOLIC PANEL
ALT: 18 U/L (ref 17–63)
ANION GAP: 5 (ref 5–15)
AST: 36 U/L (ref 15–41)
Albumin: 2.9 g/dL — ABNORMAL LOW (ref 3.5–5.0)
Alkaline Phosphatase: 38 U/L (ref 38–126)
BILIRUBIN TOTAL: 1 mg/dL (ref 0.3–1.2)
BUN: 12 mg/dL (ref 6–20)
CHLORIDE: 111 mmol/L (ref 101–111)
CO2: 22 mmol/L (ref 22–32)
Calcium: 7.8 mg/dL — ABNORMAL LOW (ref 8.9–10.3)
Creatinine, Ser: 1.27 mg/dL — ABNORMAL HIGH (ref 0.61–1.24)
Glucose, Bld: 90 mg/dL (ref 65–99)
POTASSIUM: 3 mmol/L — AB (ref 3.5–5.1)
Sodium: 138 mmol/L (ref 135–145)
TOTAL PROTEIN: 5.4 g/dL — AB (ref 6.5–8.1)

## 2017-01-04 LAB — BLOOD PRODUCT ORDER (VERBAL) VERIFICATION

## 2017-01-04 LAB — CBC
HCT: 33.4 % — ABNORMAL LOW (ref 39.0–52.0)
HEMOGLOBIN: 11.1 g/dL — AB (ref 13.0–17.0)
MCH: 31.3 pg (ref 26.0–34.0)
MCHC: 33.2 g/dL (ref 30.0–36.0)
MCV: 94.1 fL (ref 78.0–100.0)
PLATELETS: 247 10*3/uL (ref 150–400)
RBC: 3.55 MIL/uL — ABNORMAL LOW (ref 4.22–5.81)
RDW: 13 % (ref 11.5–15.5)
WBC: 11.4 10*3/uL — ABNORMAL HIGH (ref 4.0–10.5)

## 2017-01-04 LAB — PROTIME-INR
INR: 1.3
PROTHROMBIN TIME: 16.1 s — AB (ref 11.4–15.2)

## 2017-01-04 LAB — LACTIC ACID, PLASMA: Lactic Acid, Venous: 1.3 mmol/L (ref 0.5–1.9)

## 2017-01-04 LAB — SURGICAL PCR SCREEN
MRSA, PCR: NEGATIVE
Staphylococcus aureus: POSITIVE — AB

## 2017-01-04 SURGERY — LARYNGOSCOPY, DIRECT
Anesthesia: General | Site: Neck

## 2017-01-04 MED ORDER — PHENYLEPHRINE 40 MCG/ML (10ML) SYRINGE FOR IV PUSH (FOR BLOOD PRESSURE SUPPORT)
PREFILLED_SYRINGE | INTRAVENOUS | Status: AC
  Filled 2017-01-04: qty 10

## 2017-01-04 MED ORDER — PROPOFOL 10 MG/ML IV BOLUS
INTRAVENOUS | Status: AC
  Filled 2017-01-04: qty 20

## 2017-01-04 MED ORDER — LIDOCAINE-EPINEPHRINE 1 %-1:100000 IJ SOLN
INTRAMUSCULAR | Status: DC | PRN
  Administered 2017-01-04: 6 mL via INTRADERMAL

## 2017-01-04 MED ORDER — SODIUM CHLORIDE 0.9% FLUSH
10.0000 mL | INTRAVENOUS | Status: DC | PRN
  Administered 2017-01-12: 10 mL
  Filled 2017-01-04: qty 40

## 2017-01-04 MED ORDER — CHLORHEXIDINE GLUCONATE CLOTH 2 % EX PADS
6.0000 | MEDICATED_PAD | Freq: Every day | CUTANEOUS | Status: DC
  Administered 2017-01-04 – 2017-01-13 (×9): 6 via TOPICAL

## 2017-01-04 MED ORDER — SUGAMMADEX SODIUM 200 MG/2ML IV SOLN
INTRAVENOUS | Status: AC
  Filled 2017-01-04: qty 2

## 2017-01-04 MED ORDER — LACTATED RINGERS IV SOLN
INTRAVENOUS | Status: DC | PRN
  Administered 2017-01-04: 11:00:00 via INTRAVENOUS

## 2017-01-04 MED ORDER — ORAL CARE MOUTH RINSE
15.0000 mL | Freq: Two times a day (BID) | OROMUCOSAL | Status: DC
  Administered 2017-01-05 – 2017-01-13 (×16): 15 mL via OROMUCOSAL

## 2017-01-04 MED ORDER — FENTANYL CITRATE (PF) 250 MCG/5ML IJ SOLN
INTRAMUSCULAR | Status: AC
  Filled 2017-01-04: qty 5

## 2017-01-04 MED ORDER — FENTANYL CITRATE (PF) 100 MCG/2ML IJ SOLN: 50.0000 ug | Freq: Once | INTRAMUSCULAR | Status: DC

## 2017-01-04 MED ORDER — FENTANYL CITRATE (PF) 100 MCG/2ML IJ SOLN
INTRAMUSCULAR | Status: DC | PRN
  Administered 2017-01-04: 50 ug via INTRAVENOUS
  Administered 2017-01-04: 150 ug via INTRAVENOUS
  Administered 2017-01-04: 50 ug via INTRAVENOUS

## 2017-01-04 MED ORDER — FENTANYL CITRATE (PF) 100 MCG/2ML IJ SOLN
50.0000 ug | INTRAMUSCULAR | Status: DC | PRN
  Administered 2017-01-04: 100 ug via INTRAVENOUS
  Administered 2017-01-04: 50 ug via INTRAVENOUS
  Administered 2017-01-05 (×4): 100 ug via INTRAVENOUS
  Filled 2017-01-04 (×5): qty 2

## 2017-01-04 MED ORDER — WHITE PETROLATUM EX OINT
TOPICAL_OINTMENT | CUTANEOUS | Status: AC
  Filled 2017-01-04: qty 28.35

## 2017-01-04 MED ORDER — DEXAMETHASONE SODIUM PHOSPHATE 10 MG/ML IJ SOLN
INTRAMUSCULAR | Status: DC | PRN
  Administered 2017-01-04: 10 mg via INTRAVENOUS

## 2017-01-04 MED ORDER — BACITRACIN ZINC 500 UNIT/GM EX OINT
TOPICAL_OINTMENT | CUTANEOUS | Status: AC
  Filled 2017-01-04: qty 28.35

## 2017-01-04 MED ORDER — SODIUM CHLORIDE 0.9% FLUSH
10.0000 mL | Freq: Two times a day (BID) | INTRAVENOUS | Status: DC
  Administered 2017-01-04 – 2017-01-13 (×17): 10 mL

## 2017-01-04 MED ORDER — MIDAZOLAM HCL 2 MG/2ML IJ SOLN
INTRAMUSCULAR | Status: AC
  Filled 2017-01-04: qty 2

## 2017-01-04 MED ORDER — CEFAZOLIN SODIUM-DEXTROSE 2-4 GM/100ML-% IV SOLN
2000.0000 mg | Freq: Three times a day (TID) | INTRAVENOUS | Status: AC
  Administered 2017-01-04 – 2017-01-11 (×21): 2000 mg via INTRAVENOUS
  Filled 2017-01-04 (×22): qty 100

## 2017-01-04 MED ORDER — CHLORHEXIDINE GLUCONATE 0.12 % MT SOLN
15.0000 mL | Freq: Two times a day (BID) | OROMUCOSAL | Status: DC
  Administered 2017-01-04 – 2017-01-13 (×19): 15 mL via OROMUCOSAL
  Filled 2017-01-04 (×18): qty 15

## 2017-01-04 MED ORDER — PHENYLEPHRINE 40 MCG/ML (10ML) SYRINGE FOR IV PUSH (FOR BLOOD PRESSURE SUPPORT)
PREFILLED_SYRINGE | INTRAVENOUS | Status: DC | PRN
  Administered 2017-01-04: 80 ug via INTRAVENOUS

## 2017-01-04 MED ORDER — ROCURONIUM BROMIDE 100 MG/10ML IV SOLN
INTRAVENOUS | Status: DC | PRN
  Administered 2017-01-04: 50 mg via INTRAVENOUS
  Administered 2017-01-04: 10 mg via INTRAVENOUS

## 2017-01-04 MED ORDER — CEFAZOLIN SODIUM-DEXTROSE 2-3 GM-%(50ML) IV SOLR
INTRAVENOUS | Status: DC | PRN
  Administered 2017-01-04: 2 g via INTRAVENOUS

## 2017-01-04 MED ORDER — LIDOCAINE-EPINEPHRINE 1 %-1:100000 IJ SOLN
INTRAMUSCULAR | Status: AC
  Filled 2017-01-04: qty 1

## 2017-01-04 MED ORDER — MIDAZOLAM HCL 5 MG/5ML IJ SOLN
INTRAMUSCULAR | Status: DC | PRN
  Administered 2017-01-04: 2 mg via INTRAVENOUS

## 2017-01-04 MED ORDER — ONDANSETRON HCL 4 MG/2ML IJ SOLN
INTRAMUSCULAR | Status: AC
  Filled 2017-01-04: qty 2

## 2017-01-04 MED ORDER — SODIUM CHLORIDE 0.9 % IV BOLUS (SEPSIS)
1000.0000 mL | Freq: Once | INTRAVENOUS | Status: AC
  Administered 2017-01-04: 1000 mL via INTRAVENOUS

## 2017-01-04 MED ORDER — POTASSIUM CHLORIDE 10 MEQ/100ML IV SOLN
10.0000 meq | INTRAVENOUS | Status: AC
  Administered 2017-01-04 (×2): 10 meq via INTRAVENOUS
  Filled 2017-01-04 (×2): qty 100

## 2017-01-04 MED ORDER — EPINEPHRINE HCL (NASAL) 0.1 % NA SOLN
NASAL | Status: AC
  Filled 2017-01-04: qty 30

## 2017-01-04 MED ORDER — 0.9 % SODIUM CHLORIDE (POUR BTL) OPTIME
TOPICAL | Status: DC | PRN
  Administered 2017-01-04: 1000 mL

## 2017-01-04 SURGICAL SUPPLY — 50 items
BALLN PULM 15 16.5 18X75 (BALLOONS)
BALLOON PULM 15 16.5 18X75 (BALLOONS) IMPLANT
BLADE CLIPPER SURG (BLADE) IMPLANT
BLADE SURG 15 STRL LF DISP TIS (BLADE) IMPLANT
BLADE SURG 15 STRL SS (BLADE)
CANISTER SUCT 3000ML PPV (MISCELLANEOUS) ×2 IMPLANT
CLEANER TIP ELECTROSURG 2X2 (MISCELLANEOUS) IMPLANT
CONT SPEC 4OZ CLIKSEAL STRL BL (MISCELLANEOUS) ×2 IMPLANT
COVER BACK TABLE 60X90IN (DRAPES) ×2 IMPLANT
COVER MAYO STAND STRL (DRAPES) ×2 IMPLANT
COVER SURGICAL LIGHT HANDLE (MISCELLANEOUS) ×2 IMPLANT
DRAPE HALF SHEET 40X57 (DRAPES) ×2 IMPLANT
ELECT COATED BLADE 2.86 ST (ELECTRODE) ×4 IMPLANT
ELECT REM PT RETURN 9FT ADLT (ELECTROSURGICAL) ×2
ELECTRODE REM PT RTRN 9FT ADLT (ELECTROSURGICAL) ×1 IMPLANT
GAUZE SPONGE 4X4 12PLY STRL (GAUZE/BANDAGES/DRESSINGS) ×4 IMPLANT
GAUZE SPONGE 4X4 16PLY XRAY LF (GAUZE/BANDAGES/DRESSINGS) IMPLANT
GAUZE XEROFORM 1X8 LF (GAUZE/BANDAGES/DRESSINGS) ×2 IMPLANT
GAUZE XEROFORM 5X9 LF (GAUZE/BANDAGES/DRESSINGS) IMPLANT
GLOVE BIOGEL M 7.0 STRL (GLOVE) ×4 IMPLANT
GOWN STRL REUS W/ TWL LRG LVL3 (GOWN DISPOSABLE) ×1 IMPLANT
GOWN STRL REUS W/TWL LRG LVL3 (GOWN DISPOSABLE) ×1
GUARD TEETH (MISCELLANEOUS) IMPLANT
HOLDER TRACH TUBE VELCRO 19.5 (MISCELLANEOUS) IMPLANT
KIT BASIN OR (CUSTOM PROCEDURE TRAY) ×2 IMPLANT
KIT ROOM TURNOVER OR (KITS) ×2 IMPLANT
KIT SUCTION CATH 14FR (SUCTIONS) IMPLANT
NEEDLE 18GX1X1/2 (RX/OR ONLY) (NEEDLE) IMPLANT
NEEDLE HYPO 25GX1X1/2 BEV (NEEDLE) ×2 IMPLANT
NS IRRIG 1000ML POUR BTL (IV SOLUTION) ×2 IMPLANT
PACK EENT II TURBAN DRAPE (CUSTOM PROCEDURE TRAY) ×2 IMPLANT
PAD ARMBOARD 7.5X6 YLW CONV (MISCELLANEOUS) ×4 IMPLANT
PATTIES SURGICAL .5 X3 (DISPOSABLE) IMPLANT
PENCIL BUTTON HOLSTER BLD 10FT (ELECTRODE) ×4 IMPLANT
SPONGE DRAIN TRACH 4X4 STRL 2S (GAUZE/BANDAGES/DRESSINGS) ×2 IMPLANT
SPONGE INTESTINAL PEANUT (DISPOSABLE) ×2 IMPLANT
STAPLER VISISTAT 35W (STAPLE) ×2 IMPLANT
SURGILUBE 2OZ TUBE FLIPTOP (MISCELLANEOUS) ×2 IMPLANT
SUT CHROMIC 2 0 SH (SUTURE) IMPLANT
SUT ETHILON 2 0 FS 18 (SUTURE) ×2 IMPLANT
SUT SILK 2 0 PERMA HAND 18 BK (SUTURE) ×2 IMPLANT
SUT SILK 3 0 REEL (SUTURE) ×2 IMPLANT
SYR 20CC LL (SYRINGE) ×2 IMPLANT
SYR BULB IRRIGATION 50ML (SYRINGE) IMPLANT
SYR CONTROL 10ML LL (SYRINGE) ×2 IMPLANT
TOWEL OR 17X24 6PK STRL BLUE (TOWEL DISPOSABLE) ×4 IMPLANT
TUBE CONNECTING 12X1/4 (SUCTIONS) ×2 IMPLANT
TUBE TRACH SHILEY 8 DIST CUF (TUBING) ×2 IMPLANT
WATER STERILE IRR 1000ML POUR (IV SOLUTION) ×2 IMPLANT
YANKAUER SUCT BULB TIP NO VENT (SUCTIONS) ×2 IMPLANT
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
## 2017-01-04 NOTE — Anesthesia Preprocedure Evaluation (Addendum)
Anesthesia Evaluation  Patient identified by MRN, date of birth, ID band Patient unresponsive    Reviewed: Allergy & Precautions, NPO status , Patient's Chart, lab work & pertinent test results, Unable to perform ROS - Chart review only  Airway Mallampati: Intubated       Dental   Pulmonary    Pulmonary exam normal        Cardiovascular Normal cardiovascular exam     Neuro/Psych    GI/Hepatic   Endo/Other    Renal/GU      Musculoskeletal   Abdominal   Peds  Hematology   Anesthesia Other Findings   Reproductive/Obstetrics                            Anesthesia Physical Anesthesia Plan  ASA: III  Anesthesia Plan: General   Post-op Pain Management:    Induction: Inhalational  PONV Risk Score and Plan: Treatment may vary due to age or medical condition  Airway Management Planned: Oral ETT  Additional Equipment:   Intra-op Plan:   Post-operative Plan: Post-operative intubation/ventilation  Informed Consent: I have reviewed the patients History and Physical, chart, labs and discussed the procedure including the risks, benefits and alternatives for the proposed anesthesia with the patient or authorized representative who has indicated his/her understanding and acceptance.   Dental advisory given  Plan Discussed with: CRNA and Surgeon  Anesthesia Plan Comments:       Anesthesia Quick Evaluation

## 2017-01-04 NOTE — Progress Notes (Signed)
Patient placed on 28% ATC per MD request. Patient is tolerating well at this time. No complications. Vital signs stable at this time. RN and family at bedside. RT will continue to monitor.
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
## 2017-01-04 NOTE — Progress Notes (Signed)
Follow up - Trauma and Critical Care  Patient Details:    Alexander Yates is an 27 y.o. male.  Lines/tubes : Airway 7.5 mm (Active)  Secured at (cm) 24 cm 01/04/2017  7:32 AM  Measured From Lips 01/04/2017  7:32 AM  Secured Location Center 01/04/2017  7:32 AM  Secured By [employer] T[employer] 01/04/2017  7:32 AM  Tube Holder Repositioned Yes 01/04/2017  7:32 AM  Site Condition Dry 01/04/2017  7:32 AM     NG/OG Tube Orogastric Center mouth Xray (Active)  External Length of Tube (cm) - (if applicable) 47 cm 01/03/2017  9:00 PM  Site Assessment Dry;Intact 01/03/2017  9:00 PM  Ongoing Placement Verification No change in cm markings or external length of tube from initial placement;No change in respiratory status;No acute changes, not attributed to clinical condition 01/03/2017  9:00 PM  Status Clamped 01/03/2017  9:00 PM     Urethral Catheter Italychad grose RN Temperature probe 16 Fr. (Active)  Indication for Insertion or Continuance of Catheter Unstable critical patients (first 24-48 hours) 01/04/2017  7:15 AM  Site Assessment Clean;Intact 01/03/2017  9:00 PM  Catheter Maintenance Bag below level of bladder;Catheter secured;Drainage bag/tubing not touching floor;Insertion date on drainage bag;No dependent loops;Seal intact;Bag emptied prior to transport 01/03/2017  9:00 PM  Collection Container Standard drainage bag 01/03/2017  9:00 PM  Securement Method Securing device (Describe) 01/03/2017  9:00 PM  Output (mL) 45 mL 01/04/2017  6:00 AM    Microbiology/Sepsis markers: No results found for this or any previous visit.  Anti-infectives:  Anti-infectives (From admission, onward)   Start     Dose/Rate Route Frequency Ordered Stop   01/03/17 1834  ceFAZolin (ANCEF) 1,000 mg in dextrose 5 % 50 mL IVPB     over 30 Minutes Intravenous Continuous PRN 01/03/17 1835 01/03/17 1834   01/03/17 1830  ceFAZolin (ANCEF) IVPB 1 g/50 mL premix  Status:  Discontinued     1 g 100 mL/hr over 30 Minutes Intravenous  Once  01/03/17 1820 01/03/17 1953      Best Practice/Protocols:  VTE Prophylaxis: Mechanical GI Prophylaxis: Proton Pump Inhibitor Continous Sedation  Consults:     Events:  Subjective:    Overnight Issues: In spite of the sedation.the patient is wide awake.    Objective:  Vital signs for last 24 hours: Temp:  [99.1 F (37.3 C)-102.2 F (39 C)] 101.3 F (38.5 C) (01/02 0700) Pulse Rate:  [51-92] 69 (01/02 0732) Resp:  [15-19] 18 (01/02 0700) BP: (77-211)/(46-127) 101/61 (01/02 0700) SpO2:  [98 %-100 %] 100 % (01/02 0700) FiO2 (%):  [40 %-100 %] 40 % (01/02 0732) Weight:  [66.8 kg (147 lb 4.3 oz)-80 kg (176 lb 5.9 oz)] 66.8 kg (147 lb 4.3 oz) (01/01 2100)  Hemodynamic parameters for last 24 hours:    Intake/Output from previous day: 01/01 0701 - 01/02 0700 In: 2438.6 [I.V.:1438.6; IV Piggyback:1000] Out: 565 [Urine:565]  Intake/Output this shift: No intake/output data recorded.  Vent settings for last 24 hours: Vent Mode: PRVC FiO2 (%):  [40 %-100 %] 40 % Set Rate:  [18 bmp] 18 bmp Vt Set:  [620 mL] 620 mL PEEP:  [5 cmH20] 5 cmH20 Plateau Pressure:  [16 cmH20-21 cmH20] 18 cmH20  Physical Exam:  General: alert and no respiratory distress Neuro: alert, oriented and nonfocal exam Resp: clear to auscultation bilaterally CVS: regular rate and rhythm, S1, S2 normal, no murmur, click, rub or gallop GI: soft, nontender, BS WNL, no r/g Extremities: no edema,  no erythema, pulses WNL  Results for orders placed or performed during the hospital encounter of 01/03/17 (from the past 24 hour(s))  Prepare fresh frozen plasma     Status: None   Collection Time: 01/03/17  5:12 PM  Result Value Ref Range   Unit Number [id number]    Blood Component Type LIQ PLASMA    Unit division 00    Status of Unit REL FROM [facility]    Unit tag comment VERBAL ORDERS PER DR MILLER    Transfusion Status OK TO TRANSFUSE    Unit Number [id number]    Blood Component Type LIQ PLASMA    Unit  division 00    Status of Unit REL FROM [facility]    Unit tag comment VERBAL ORDERS PER DR MILLER    Transfusion Status OK TO TRANSFUSE   Type and screen Ordered by PROVIDER DEFAULT     Status: None   Collection Time: 01/03/17  5:25 PM  Result Value Ref Range   ABO/RH(D) A POS    Antibody Screen NEG    Sample Expiration 01/06/2017    Unit Number [id number]    Blood Component Type RED CELLS,LR    Unit division 00    Status of Unit REL FROM [facility]    Unit tag comment VERBAL ORDERS PER DR MILLER    Transfusion Status OK TO TRANSFUSE    Crossmatch Result NOT NEEDED    Unit Number [id number]    Blood Component Type RBC LR PHER1    Unit division 00    Status of Unit REL FROM [facility]    Unit tag comment VERBAL ORDERS PER DR MILLER    Transfusion Status OK TO TRANSFUSE    Crossmatch Result NOT NEEDED   CDS serology     Status: None   Collection Time: 01/03/17  5:25 PM  Result Value Ref Range   CDS serology specimen      SPECIMEN WILL BE HELD FOR 14 DAYS IF TESTING IS REQUIRED  Comprehensive metabolic panel     Status: Abnormal   Collection Time: 01/03/17  5:25 PM  Result Value Ref Range   Sodium 138 135 - 145 mmol/L   Potassium 2.7 (LL) 3.5 - 5.1 mmol/L   Chloride 101 101 - 111 mmol/L   CO2 24 22 - 32 mmol/L   Glucose, Bld 118 (H) 65 - 99 mg/dL   BUN 15 6 - 20 mg/dL   Creatinine, Ser 3.23 (H) 0.61 - 1.24 mg/dL   Calcium 9.1 8.9 - 55.7 mg/dL   Total Protein 7.6 6.5 - 8.1 g/dL   Albumin 4.0 3.5 - 5.0 g/dL   AST 26 15 - 41 U/L   ALT 20 17 - 63 U/L   Alkaline Phosphatase 53 38 - 126 U/L   Total Bilirubin 1.3 (H) 0.3 - 1.2 mg/dL   GFR calc non Af Amer >60 >60 mL/min   GFR calc Af Amer >60 >60 mL/min   Anion gap 13 5 - 15  CBC     Status: Abnormal   Collection Time: 01/03/17  5:25 PM  Result Value Ref Range   WBC 9.4 4.0 - 10.5 K/uL   RBC 4.44 4.22 - 5.81 MIL/uL   Hemoglobin 13.9 13.0 - 17.0 g/dL   HCT 32.2 02.5 - 42.7 %   MCV 92.6 78.0 - 100.0 fL   MCH 31.3 26.0 - 34.0 pg    MCHC 33.8 30.0 - 36.0 g/dL   RDW 06.2 37.6 - 28.3 %  Platelets 401 (H) 150 - 400 K/uL  Ethanol     Status: None   Collection Time: 01/03/17  5:25 PM  Result Value Ref Range   Alcohol, Ethyl (B) <10 <10 mg/dL  Protime-INR     Status: None   Collection Time: 01/03/17  5:25 PM  Result Value Ref Range   Prothrombin Time 14.4 11.4 - 15.2 seconds   INR 1.13   ABO/Rh     Status: None   Collection Time: 01/03/17  5:25 PM  Result Value Ref Range   ABO/RH(D) A POS   I-Stat Chem 8, ED     Status: Abnormal   Collection Time: 01/03/17  5:32 PM  Result Value Ref Range   Sodium 142 135 - 145 mmol/L   Potassium 2.7 (LL) 3.5 - 5.1 mmol/L   Chloride 104 101 - 111 mmol/L   BUN 16 6 - 20 mg/dL   Creatinine, Ser 1.61 (H) 0.61 - 1.24 mg/dL   Glucose, Bld 096 (H) 65 - 99 mg/dL   Calcium, Ion 0.45 (L) 1.15 - 1.40 mmol/L   TCO2 24 22 - 32 mmol/L   Hemoglobin 14.3 13.0 - 17.0 g/dL   HCT 40.9 81.1 - 91.4 %   Comment NOTIFIED PHYSICIAN   I-Stat CG4 Lactic Acid, ED     Status: Abnormal   Collection Time: 01/03/17  5:32 PM  Result Value Ref Range   Lactic Acid, Venous 2.81 (HH) 0.5 - 1.9 mmol/L   Comment NOTIFIED PHYSICIAN   I-Stat arterial blood gas, ED     Status: Abnormal   Collection Time: 01/03/17  7:08 PM  Result Value Ref Range   pH, Arterial 7.434 7.350 - 7.450   pCO2 arterial 36.9 32.0 - 48.0 mmHg   pO2, Arterial 473.0 (H) 83.0 - 108.0 mmHg   Bicarbonate 24.7 20.0 - 28.0 mmol/L   TCO2 26 22 - 32 mmol/L   O2 Saturation 100.0 %   Acid-Base Excess 1.0 0.0 - 2.0 mmol/L   Patient temperature HIDE    Sample type ARTERIAL   Urinalysis, Routine w reflex microscopic     Status: Abnormal   Collection Time: 01/03/17  8:39 PM  Result Value Ref Range   Color, Urine YELLOW YELLOW   APPearance CLEAR CLEAR   Specific Gravity, Urine 1.039 (H) 1.005 - 1.030   pH 6.0 5.0 - 8.0   Glucose, UA NEGATIVE NEGATIVE mg/dL   Hgb urine dipstick NEGATIVE NEGATIVE   Bilirubin Urine NEGATIVE NEGATIVE   Ketones,  ur NEGATIVE NEGATIVE mg/dL   Protein, ur NEGATIVE NEGATIVE mg/dL   Nitrite NEGATIVE NEGATIVE   Leukocytes, UA NEGATIVE NEGATIVE  Potassium     Status: None   Collection Time: 01/03/17  8:43 PM  Result Value Ref Range   Potassium 3.6 3.5 - 5.1 mmol/L  Triglycerides     Status: Abnormal   Collection Time: 01/03/17  8:43 PM  Result Value Ref Range   Triglycerides 249 (H) <150 mg/dL  CBC     Status: Abnormal   Collection Time: 01/04/17  3:47 AM  Result Value Ref Range   WBC 11.4 (H) 4.0 - 10.5 K/uL   RBC 3.55 (L) 4.22 - 5.81 MIL/uL   Hemoglobin 11.1 (L) 13.0 - 17.0 g/dL   HCT 78.2 (L) 95.6 - 21.3 %   MCV 94.1 78.0 - 100.0 fL   MCH 31.3 26.0 - 34.0 pg   MCHC 33.2 30.0 - 36.0 g/dL   RDW 08.6 57.8 - 46.9 %   Platelets  247 150 - 400 K/uL  Protime-INR     Status: Abnormal   Collection Time: 01/04/17  3:47 AM  Result Value Ref Range   Prothrombin Time 16.1 (H) 11.4 - 15.2 seconds   INR 1.30   Comprehensive metabolic panel     Status: Abnormal   Collection Time: 01/04/17  3:47 AM  Result Value Ref Range   Sodium 138 135 - 145 mmol/L   Potassium 3.0 (L) 3.5 - 5.1 mmol/L   Chloride 111 101 - 111 mmol/L   CO2 22 22 - 32 mmol/L   Glucose, Bld 90 65 - 99 mg/dL   BUN 12 6 - 20 mg/dL   Creatinine, Ser 1.61 (H) 0.61 - 1.24 mg/dL   Calcium 7.8 (L) 8.9 - 10.3 mg/dL   Total Protein 5.4 (L) 6.5 - 8.1 g/dL   Albumin 2.9 (L) 3.5 - 5.0 g/dL   AST 36 15 - 41 U/L   ALT 18 17 - 63 U/L   Alkaline Phosphatase 38 38 - 126 U/L   Total Bilirubin 1.0 0.3 - 1.2 mg/dL   GFR calc non Af Amer >60 >60 mL/min   GFR calc Af Amer >60 >60 mL/min   Anion gap 5 5 - 15  Provider-confirm verbal Blood Bank order - RBC, FFP, Type & Screen; 2 Units; Order taken: 09604; 54098; Level 1 Trauma, Emergency Release, STAT 2 units of  O negative red cells and 2 units of A plasma emergency released to the ER @ 1722. All units...     Status: None   Collection Time: 01/04/17  7:30 AM  Result Value Ref Range   Blood product  order confirm MD AUTHORIZATION REQUESTED      Assessment/Plan:   NEURO  Altered Mental Status:  calm and appropriate.   Plan: No concerns  PULM  No pulmonary issues   Plan: CPM.  If tracheal injury will get trach per Dr. Annalee Genta and should wean fairly rapidly to [facility]  CARDIO  No issues   Plan: CPM  RENAL  Urine output is adequated, creatinine 1.27   Plan: No changes  GI  Potential esophageal trauma.  Will know more after neck exploration.   Plan: CPM.  Has OGT in plae and apparently it passed failry easily.  ID  No known infectious sources   Plan: CPM  HEME  Anemia acute blood loss anemia)   Plan: Hemoglobin down to 11.1, but does not require transfusion as he is not hypotensive.  ENDO No isses.   Plan: CPM  Global Issues  Patient going to the OR today for trach and neck explration.  If he has an esophageal injur we will need to repair it directly and likely with a patch.  He should wean fairly easily after tracheostomy is performed    LOS: 1 day   Additional comments:I reviewed the patient's new clinical lab test results. cbc/bmet and I reviewed the patients new imaging test results. cxr  Critical Care Total Time*: 30 Minutes  Jimmye Norman 01/04/2017  *Care during the described time interval was provided by me and/or other providers on the critical care team.  I have reviewed this patient's available data, including medical history, events of note, physical examination and test results as part of my evaluation.
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
## 2017-01-04 NOTE — Progress Notes (Deleted)
PHARMACY - ADULT TOTAL PARENTERAL NUTRITION CONSULT NOTE    Received consult to initiate TPN.  Will start TPN tomorrow at the earliest.  Will also consult RD for nutritional assessment.  If not severely malnourished or nutritionally at risk, consider postponing TPN until day #7 of admission.    TPN labs x 1 in AM   Alexander Yates, PharmD, BCPS Pager:  [phone] 01/04/2017, 4:56 PM

## 2017-01-04 NOTE — Progress Notes (Addendum)
PHARMACY - ADULT TOTAL PARENTERAL NUTRITION CONSULT NOTE    Received consult to initiate TPN after hour.  Spoke to Dr. Lindie SpruceWyatt who confirmed that patient is not severely malnourished nor nutritionally at risk.  Patient will not be able to have a swallow eval/oral diet for at least one week.  MD hesitant to delay TPN until day #7 of admission.  Will consult RD for nutritional assessment.  If RD in agreement to delay TPN, will touch base with Dr.Wyatt again tomorrow to delay TPN until this weekend.  There is also a possibility of getting a feeding tube placed by IR, but the procedure might be risky and MD will consider.  TPN labs x 1 in AM   Ajia Chadderdon D. Laney Potashang, PharmD, BCPS Pager:  [phone] 01/04/2017, 4:56 PM

## 2017-01-04 NOTE — Anesthesia Postprocedure Evaluation (Signed)
Anesthesia Post Note  Patient: Allena EaringChristopher D XXXGraves  Procedure(s) Performed: DIRECT LARYNGOSCOPY AND ESOPHAGOSCOPY (N/A Neck) TRACHEOSTOMY (N/A Neck)     Patient location during evaluation: SICU Anesthesia Type: General Level of consciousness: patient remains intubated per anesthesia plan Pain management: pain level controlled Vital Signs Assessment: post-procedure vital signs reviewed and stable Respiratory status: patient on ventilator - see flowsheet for VS Cardiovascular status: stable Postop Assessment: no apparent nausea or vomiting Anesthetic complications: no    Last Vitals:  Vitals:   01/04/17 1100 01/04/17 1200  BP: 110/67 101/61  Pulse: (!) 58 61  Resp: 18 18  Temp: (!) 38.1 C (!) 38.2 C  SpO2: 100% 100%    Last Pain:  Vitals:   01/04/17 1200  TempSrc: Bladder                 Theon Sobotka DAVID

## 2017-01-04 NOTE — Progress Notes (Signed)
Peripherally Inserted Central Catheter/Midline Placement  The IV Nurse has discussed with the patient and/or persons authorized to consent for the patient, the purpose of this procedure and the potential benefits and risks involved with this procedure.  The benefits include less needle sticks, lab draws from the catheter, and the patient may be discharged home with the catheter. Risks include, but not limited to, infection, bleeding, blood clot (thrombus formation), and puncture of an artery; nerve damage and irregular heartbeat and possibility to perform a PICC exchange if needed/ordered by physician.  Alternatives to this procedure were also discussed.  Bard Power PICC patient education guide, fact sheet on infection prevention and patient information card has been provided to patient /or left at bedside.  Signed by mother per Pt.s request (on Vent)  PICC/Midline Placement Documentation  PICC Double Lumen 01/04/17 PICC Right Brachial 39 cm 0 cm (Active)  Indication for Insertion or Continuance of Line Administration of hyperosmolar/irritating solutions (i.e. TPN, Vancomycin, etc.) 01/04/2017  6:45 PM  Exposed Catheter (cm) 0 cm 01/04/2017  6:45 PM  Site Assessment Clean;Dry;Intact 01/04/2017  6:45 PM  Lumen #1 Status Flushed;Saline locked;Blood return noted 01/04/2017  6:45 PM  Lumen #2 Status Flushed;Saline locked;Blood return noted 01/04/2017  6:45 PM  Dressing Type Transparent 01/04/2017  6:45 PM  Dressing Status Clean;Dry;Intact 01/04/2017  6:45 PM  Dressing Change Due 01/11/17 01/04/2017  6:45 PM       Ethelda Chickurrie, Alta Shober Robert 01/04/2017, 6:46 PM
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
## 2017-01-04 NOTE — Transfer of Care (Signed)
Immediate Anesthesia Transfer of Care Note  Patient: Alexander EaringChristopher D XXXGraves  Procedure(s) Performed: DIRECT LARYNGOSCOPY AND ESOPHAGOSCOPY (N/A Neck) TRACHEOSTOMY (N/A Neck)  Patient Location: ICU  Anesthesia Type:General  Level of Consciousness: sedated and Patient remains intubated per anesthesia plan  Airway & Oxygen Therapy: Patient remains intubated per anesthesia plan and Patient placed on Ventilator (see vital sign flow sheet for setting)  Post-op Assessment: Report given to RN and Post -op Vital signs reviewed and stable  Post vital signs: Reviewed and stable  Last Vitals:  Vitals:   01/04/17 1100 01/04/17 1200  BP: 110/67 101/61  Pulse: (!) 58 61  Resp: 18 18  Temp: (!) 38.1 C (!) 38.2 C  SpO2: 100% 100%    Last Pain:  Vitals:   01/04/17 1200  TempSrc: Bladder         Complications: No apparent anesthesia complications

## 2017-01-05 ENCOUNTER — Encounter (HOSPITAL_COMMUNITY): Payer: Self-pay | Admitting: Otolaryngology

## 2017-01-05 LAB — COMPREHENSIVE METABOLIC PANEL
ALBUMIN: 2.8 g/dL — AB (ref 3.5–5.0)
ALT: 18 U/L (ref 17–63)
ANION GAP: 4 — AB (ref 5–15)
AST: 40 U/L (ref 15–41)
Alkaline Phosphatase: 41 U/L (ref 38–126)
BUN: 7 mg/dL (ref 6–20)
CHLORIDE: 109 mmol/L (ref 101–111)
CO2: 24 mmol/L (ref 22–32)
Calcium: 8.5 mg/dL — ABNORMAL LOW (ref 8.9–10.3)
Creatinine, Ser: 1.11 mg/dL (ref 0.61–1.24)
GFR calc Af Amer: >60 mL/min (ref >60)
GFR calc non Af Amer: >60 mL/min (ref >60)
GLUCOSE: 101 mg/dL — AB (ref 65–99)
Potassium: 4 mmol/L (ref 3.5–5.1)
SODIUM: 137 mmol/L (ref 135–145)
Total Bilirubin: 1.5 mg/dL — ABNORMAL HIGH (ref 0.3–1.2)
Total Protein: 5.8 g/dL — ABNORMAL LOW (ref 6.5–8.1)

## 2017-01-05 LAB — CBC
HCT: 31.8 % — ABNORMAL LOW (ref 39.0–52.0)
Hemoglobin: 10.7 g/dL — ABNORMAL LOW (ref 13.0–17.0)
MCH: 31.3 pg (ref 26.0–34.0)
MCHC: 33.6 g/dL (ref 30.0–36.0)
MCV: 93 fL (ref 78.0–100.0)
PLATELETS: 240 10*3/uL (ref 150–400)
RBC: 3.42 MIL/uL — AB (ref 4.22–5.81)
RDW: 12.6 % (ref 11.5–15.5)
WBC: 14 10*3/uL — AB (ref 4.0–10.5)

## 2017-01-05 LAB — DIFFERENTIAL
BASOS ABS: 0 10*3/uL (ref 0.0–0.1)
Basophils Relative: 0 %
EOS ABS: 0 10*3/uL (ref 0.0–0.7)
Eosinophils Relative: 0 %
LYMPHS PCT: 9 %
Lymphs Abs: 1.3 10*3/uL (ref 0.7–4.0)
MONO ABS: 0.8 10*3/uL (ref 0.1–1.0)
Monocytes Relative: 6 %
NEUTROS PCT: 85 %
Neutro Abs: 11.9 10*3/uL — ABNORMAL HIGH (ref 1.7–7.7)

## 2017-01-05 LAB — PREALBUMIN: Prealbumin: 14.1 mg/dL — ABNORMAL LOW (ref 18–38)

## 2017-01-05 LAB — MAGNESIUM: Magnesium: 1.6 mg/dL — ABNORMAL LOW (ref 1.7–2.4)

## 2017-01-05 LAB — TRIGLYCERIDES: TRIGLYCERIDES: 46 mg/dL (ref <150)

## 2017-01-05 LAB — PHOSPHORUS: PHOSPHORUS: 3.4 mg/dL (ref 2.5–4.6)

## 2017-01-05 MED ORDER — KCL IN DEXTROSE-NACL 20-5-0.45 MEQ/L-%-% IV SOLN
INTRAVENOUS | Status: DC
  Administered 2017-01-05 – 2017-01-06 (×3): via INTRAVENOUS
  Filled 2017-01-05 (×6): qty 1000

## 2017-01-05 MED ORDER — THIAMINE HCL 100 MG/ML IJ SOLN
100.0000 mg | Freq: Every day | INTRAMUSCULAR | Status: DC
  Administered 2017-01-05 – 2017-01-09 (×5): 100 mg via INTRAVENOUS
  Filled 2017-01-05 (×5): qty 2

## 2017-01-05 MED ORDER — HYDROMORPHONE HCL 1 MG/ML IJ SOLN
1.0000 mg | INTRAMUSCULAR | Status: DC | PRN
  Administered 2017-01-05: 2 mg via INTRAVENOUS
  Administered 2017-01-05: 1 mg via INTRAVENOUS
  Administered 2017-01-05: 2 mg via INTRAVENOUS
  Administered 2017-01-05: 1.5 mg via INTRAVENOUS
  Administered 2017-01-06 (×6): 2 mg via INTRAVENOUS
  Administered 2017-01-06 (×2): 1 mg via INTRAVENOUS
  Administered 2017-01-07 (×2): 2 mg via INTRAVENOUS
  Administered 2017-01-07: 1 mg via INTRAVENOUS
  Administered 2017-01-07: 2 mg via INTRAVENOUS
  Filled 2017-01-05 (×3): qty 2
  Filled 2017-01-05 (×2): qty 1
  Filled 2017-01-05 (×2): qty 2
  Filled 2017-01-05: qty 1
  Filled 2017-01-05 (×5): qty 2
  Filled 2017-01-05: qty 1
  Filled 2017-01-05 (×2): qty 2
  Filled 2017-01-05: qty 1.5

## 2017-01-05 MED ORDER — CHLORHEXIDINE GLUCONATE CLOTH 2 % EX PADS
6.0000 | MEDICATED_PAD | Freq: Every day | CUTANEOUS | Status: DC
  Administered 2017-01-05: 6 via TOPICAL

## 2017-01-05 MED ORDER — MUPIROCIN 2 % EX OINT
1.0000 "application " | TOPICAL_OINTMENT | Freq: Two times a day (BID) | CUTANEOUS | Status: AC
  Administered 2017-01-05 – 2017-01-09 (×10): 1 via NASAL
  Filled 2017-01-05 (×3): qty 22

## 2017-01-05 MED ORDER — FOLIC ACID 5 MG/ML IJ SOLN
1.0000 mg | Freq: Every day | INTRAMUSCULAR | Status: DC
  Administered 2017-01-05 – 2017-01-09 (×5): 1 mg via INTRAVENOUS
  Filled 2017-01-05 (×6): qty 0.2

## 2017-01-05 MED ORDER — LORAZEPAM 2 MG/ML IJ SOLN
2.0000 mg | INTRAMUSCULAR | Status: DC | PRN
  Administered 2017-01-09 – 2017-01-10 (×6): 2 mg via INTRAVENOUS
  Filled 2017-01-05 (×6): qty 1

## 2017-01-05 NOTE — Progress Notes (Signed)
   01/05/17 1500  Clinical Encounter Type  Visited With Patient  Visit Type Initial  Consult/Referral To Chaplain  Spiritual Encounters  Spiritual Needs (none identified)  Stress Factors  Patient Stress Factors Health changes  Chaplain had a brief introductory visit with the PT and another person in the room.  PT did not talk nor did the PT guest.  Chaplain informed PT that support is available for needs he might have from spiritual care.
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
## 2017-01-05 NOTE — Progress Notes (Signed)
Pt arrived to 4NP10 via bed with RN and NT present.  Vitals and assessment stable at handoff, see flowsheets
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
## 2017-01-06 ENCOUNTER — Encounter (HOSPITAL_COMMUNITY): Payer: Self-pay

## 2017-01-06 ENCOUNTER — Other Ambulatory Visit: Payer: Self-pay

## 2017-01-06 MED ORDER — INFLUENZA VAC SPLIT QUAD 0.5 ML IM SUSY
0.5000 mL | PREFILLED_SYRINGE | INTRAMUSCULAR | Status: AC
  Administered 2017-01-07: 0.5 mL via INTRAMUSCULAR
  Filled 2017-01-06: qty 0.5

## 2017-01-06 MED ORDER — PNEUMOCOCCAL VAC POLYVALENT 25 MCG/0.5ML IJ INJ
0.5000 mL | INJECTION | INTRAMUSCULAR | Status: AC
  Administered 2017-01-07: 0.5 mL via INTRAMUSCULAR
  Filled 2017-01-06: qty 0.5
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
## 2017-01-07 MED ORDER — HYDROMORPHONE HCL 1 MG/ML IJ SOLN
1.0000 mg | INTRAMUSCULAR | Status: DC | PRN
  Administered 2017-01-07 – 2017-01-08 (×4): 2 mg via INTRAVENOUS
  Administered 2017-01-08: 1 mg via INTRAVENOUS
  Administered 2017-01-08 (×2): 2 mg via INTRAVENOUS
  Administered 2017-01-08: 1 mg via INTRAVENOUS
  Administered 2017-01-08 – 2017-01-09 (×3): 2 mg via INTRAVENOUS
  Administered 2017-01-09: 1 mg via INTRAVENOUS
  Administered 2017-01-09 – 2017-01-13 (×24): 2 mg via INTRAVENOUS
  Administered 2017-01-13: 1 mg via INTRAVENOUS
  Administered 2017-01-13: 2 mg via INTRAVENOUS
  Administered 2017-01-13: 1 mg via INTRAVENOUS
  Administered 2017-01-14 (×2): 2 mg via INTRAVENOUS
  Filled 2017-01-07 (×6): qty 2
  Filled 2017-01-07: qty 1
  Filled 2017-01-07: qty 2
  Filled 2017-01-07: qty 1
  Filled 2017-01-07 (×4): qty 2
  Filled 2017-01-07: qty 1
  Filled 2017-01-07: qty 2
  Filled 2017-01-07: qty 1
  Filled 2017-01-07 (×10): qty 2
  Filled 2017-01-07 (×2): qty 1
  Filled 2017-01-07 (×2): qty 2
  Filled 2017-01-07 (×2): qty 1
  Filled 2017-01-07 (×13): qty 2

## 2017-01-07 MED ORDER — KETOROLAC TROMETHAMINE 30 MG/ML IJ SOLN
30.0000 mg | Freq: Four times a day (QID) | INTRAMUSCULAR | Status: AC | PRN
  Administered 2017-01-07 – 2017-01-12 (×10): 30 mg via INTRAVENOUS
  Filled 2017-01-07 (×10): qty 1
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
## 2017-01-07 NOTE — Progress Notes (Signed)
RT Note: Patient continues to have drainage from around his trach. Some reddening of the skin is noted. His RN just finished doing trach care and drainage has already started again from the site. A trach drain gauze was placed over the trach flanges to absorb some of the drainage. We were unable to get it under the flanges because of the sutures being in place. His RN, Othelia PullingCystal is notifying the MD about the drainage and reddening of the skin. Patient is in no respiratory distress presently. He is alert and oriented and able to follow commands. Rt will continue to assist as needed.

## 2017-01-08 LAB — BASIC METABOLIC PANEL WITH GFR
Anion gap: 4 — ABNORMAL LOW (ref 5–15)
BUN: 11 mg/dL (ref 6–20)
CO2: 23 mmol/L (ref 22–32)
Calcium: 7.5 mg/dL — ABNORMAL LOW (ref 8.9–10.3)
Chloride: 102 mmol/L (ref 101–111)
Creatinine, Ser: 0.88 mg/dL (ref 0.61–1.24)
GFR calc Af Amer: >60 mL/min
GFR calc non Af Amer: >60 mL/min
Glucose, Bld: 536 mg/dL (ref 65–99)
Potassium: 5.9 mmol/L — ABNORMAL HIGH (ref 3.5–5.1)
Sodium: 129 mmol/L — ABNORMAL LOW (ref 135–145)

## 2017-01-08 LAB — GLUCOSE, CAPILLARY
Glucose-Capillary: 85 mg/dL (ref 65–99)
Glucose-Capillary: 70 mg/dL (ref 65–99)
Glucose-Capillary: 60 mg/dL — ABNORMAL LOW (ref 65–99)

## 2017-01-08 MED ORDER — DEXTROSE 50 % IV SOLN
INTRAVENOUS | Status: AC
Start: 2017-01-08 — End: 2017-01-09
  Administered 2017-01-08 – 2017-01-09 (×2): 25 mL
  Filled 2017-01-08: qty 50

## 2017-01-08 MED ORDER — SODIUM CHLORIDE 0.9 % IV SOLN
INTRAVENOUS | Status: DC
  Administered 2017-01-08: 90 mL/h via INTRAVENOUS
  Administered 2017-01-08: 13:00:00 via INTRAVENOUS

## 2017-01-08 MED ORDER — INSULIN ASPART 100 UNIT/ML ~~LOC~~ SOLN: 0.0000 [IU] | SUBCUTANEOUS | Status: DC

## 2017-01-08 NOTE — Progress Notes (Signed)
[name], male   DOB: September 30, 1990, 27 y.o.   MRN: [id number] I was called yesterday for advice on the tracheostomy.  They are having difficulty getting a dressing and underneath the shield which was still sutured in place.  I instructed them that it was okay to cut the sutures and to be very careful not to accidentally pull the tracheostomy tube out.  They were able to do this successfully.  The tracheostomy is well seated and there is a dressing in place.  No complications.  We will continue to follow.

## 2017-01-08 NOTE — Progress Notes (Signed)
Pt EKG noted to have peak T wave, last potassium level 4.0 3 days ago, and D5 half NS with 20 MEq infusing at 7590ml/hr. Rounding MD notified and BMET ordered.

## 2017-01-08 NOTE — Progress Notes (Addendum)
CRITICAL VALUE ALERT  Critical Value:  536 glucose  Date & Time Notied:  01/08/17 1152  Provider Notified: Magnus IvanBlackman, Trauma  Orders Received/Actions taken: routine CBG checks initiated with insulin sliding scale.   Pt is asymptomatic with hyperglycemia

## 2017-01-08 NOTE — Progress Notes (Signed)
Changed water bottle and trach set up 

## 2017-01-08 NOTE — Progress Notes (Addendum)
Awaiting blood glucose checks and sliding order from Trauma MD. MD paged x2 with no return call and no new orders available. Patient continues to be asymptomatic, but is reading HI on glucometer

## 2017-01-08 NOTE — Progress Notes (Signed)
Pt noted to have copious amount of jelly-like exudate from trach site requiring intervention every hour. Surrounding skin noted to be red and irritated and the pt c/o itching. RN unable to get drainage sponge under trach d/t sutures. ENT notified and order obtained to remove suture to provide necessary care. Sutures removed without incident and drained sponge place to help absorb exudate.
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
## 2017-01-08 NOTE — Progress Notes (Signed)
Potassium level is 5.9. MD notified and order obtained to discontinue current fluids ( D5 half NS w/ 20 K) and start NS @ 90 ml/hr.
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
## 2017-01-09 LAB — BASIC METABOLIC PANEL
Anion gap: 8 (ref 5–15)
BUN: 15 mg/dL (ref 6–20)
CALCIUM: 7.7 mg/dL — AB (ref 8.9–10.3)
CO2: 24 mmol/L (ref 22–32)
CREATININE: 0.84 mg/dL (ref 0.61–1.24)
Chloride: 107 mmol/L (ref 101–111)
GFR calc non Af Amer: >60 mL/min (ref >60)
Glucose, Bld: 78 mg/dL (ref 65–99)
Potassium: 3 mmol/L — ABNORMAL LOW (ref 3.5–5.1)
SODIUM: 139 mmol/L (ref 135–145)

## 2017-01-09 LAB — GLUCOSE, CAPILLARY
GLUCOSE-CAPILLARY: 109 mg/dL — AB (ref 65–99)
GLUCOSE-CAPILLARY: 80 mg/dL (ref 65–99)
GLUCOSE-CAPILLARY: 84 mg/dL (ref 65–99)
GLUCOSE-CAPILLARY: 76 mg/dL (ref 65–99)
Glucose-Capillary: 126 mg/dL — ABNORMAL HIGH (ref 65–99)
Glucose-Capillary: 65 mg/dL (ref 65–99)
Glucose-Capillary: 73 mg/dL (ref 65–99)

## 2017-01-09 MED ORDER — POTASSIUM CHLORIDE 10 MEQ/50ML IV SOLN
10.0000 meq | INTRAVENOUS | Status: AC
  Administered 2017-01-09 (×5): 10 meq via INTRAVENOUS
  Filled 2017-01-09 (×5): qty 50

## 2017-01-09 MED ORDER — DEXTROSE-NACL 5-0.45 % IV SOLN
INTRAVENOUS | Status: AC
  Administered 2017-01-09 – 2017-01-10 (×2): via INTRAVENOUS

## 2017-01-09 MED ORDER — ENOXAPARIN SODIUM 40 MG/0.4ML ~~LOC~~ SOLN
40.0000 mg | SUBCUTANEOUS | Status: DC
  Administered 2017-01-09 – 2017-01-13 (×5): 40 mg via SUBCUTANEOUS
  Filled 2017-01-09 (×5): qty 0.4

## 2017-01-09 MED ORDER — INSULIN ASPART 100 UNIT/ML ~~LOC~~ SOLN: 0.0000 [IU] | Freq: Three times a day (TID) | SUBCUTANEOUS | Status: DC

## 2017-01-09 MED ORDER — BISACODYL 10 MG RE SUPP: 10.0000 mg | Freq: Every day | RECTAL | Status: DC | PRN

## 2017-01-09 MED ORDER — TRAVASOL 10 % IV SOLN
INTRAVENOUS | Status: AC
  Administered 2017-01-09: 18:00:00 via INTRAVENOUS
  Filled 2017-01-09: qty 528

## 2017-01-09 MED ORDER — POTASSIUM CHLORIDE 10 MEQ/50ML IV SOLN
10.0000 meq | INTRAVENOUS | Status: DC
Start: 2017-01-09 — End: 2017-01-09

## 2017-01-09 MED ORDER — DEXTROSE-NACL 5-0.45 % IV SOLN
INTRAVENOUS | Status: AC
  Administered 2017-01-09: 09:00:00 via INTRAVENOUS

## 2017-01-09 MED ORDER — INSULIN ASPART 100 UNIT/ML ~~LOC~~ SOLN: 0.0000 [IU] | SUBCUTANEOUS | Status: DC

## 2017-01-09 MED ORDER — DEXTROSE 50 % IV SOLN
INTRAVENOUS | Status: AC
  Filled 2017-01-09: qty 50
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
## 2017-01-09 NOTE — Plan of Care (Signed)
  Clinical Measurements: Ability to maintain clinical measurements within normal limits will improve 01/09/2017 0355 - Progressing by Luther Redourgott, Rubert Frediani, RN   Pain Managment: General experience of comfort will improve 01/09/2017 0355 - Progressing by Luther Redourgott, Evonda Enge, RN
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
## 2017-01-10 LAB — DIFFERENTIAL
BASOS PCT: 0 %
Basophils Absolute: 0 10*3/uL (ref 0.0–0.1)
Eosinophils Absolute: 0.2 10*3/uL (ref 0.0–0.7)
Eosinophils Relative: 2 %
LYMPHS PCT: 14 %
Lymphs Abs: 1 10*3/uL (ref 0.7–4.0)
MONO ABS: 0.9 10*3/uL (ref 0.1–1.0)
MONOS PCT: 12 %
NEUTROS ABS: 5.4 10*3/uL (ref 1.7–7.7)
Neutrophils Relative %: 72 %

## 2017-01-10 LAB — GLUCOSE, CAPILLARY
GLUCOSE-CAPILLARY: 109 mg/dL — AB (ref 65–99)
Glucose-Capillary: 102 mg/dL — ABNORMAL HIGH (ref 65–99)
Glucose-Capillary: 108 mg/dL — ABNORMAL HIGH (ref 65–99)
Glucose-Capillary: 111 mg/dL — ABNORMAL HIGH (ref 65–99)
Glucose-Capillary: 145 mg/dL — ABNORMAL HIGH (ref 65–99)
Glucose-Capillary: 84 mg/dL (ref 65–99)

## 2017-01-10 LAB — COMPREHENSIVE METABOLIC PANEL
ALBUMIN: 2.6 g/dL — AB (ref 3.5–5.0)
ALK PHOS: 37 U/L — AB (ref 38–126)
ALT: 15 U/L — AB (ref 17–63)
AST: 22 U/L (ref 15–41)
Anion gap: 11 (ref 5–15)
BILIRUBIN TOTAL: 1 mg/dL (ref 0.3–1.2)
BUN: 11 mg/dL (ref 6–20)
CO2: 21 mmol/L — ABNORMAL LOW (ref 22–32)
CREATININE: 0.92 mg/dL (ref 0.61–1.24)
Calcium: 8.5 mg/dL — ABNORMAL LOW (ref 8.9–10.3)
Chloride: 106 mmol/L (ref 101–111)
GFR calc Af Amer: >60 mL/min (ref >60)
GFR calc non Af Amer: >60 mL/min (ref >60)
GLUCOSE: 113 mg/dL — AB (ref 65–99)
POTASSIUM: 3.5 mmol/L (ref 3.5–5.1)
Sodium: 138 mmol/L (ref 135–145)
TOTAL PROTEIN: 5.8 g/dL — AB (ref 6.5–8.1)

## 2017-01-10 LAB — PHOSPHORUS: Phosphorus: 3.5 mg/dL (ref 2.5–4.6)

## 2017-01-10 LAB — CBC
HEMATOCRIT: 27.8 % — AB (ref 39.0–52.0)
Hemoglobin: 9.7 g/dL — ABNORMAL LOW (ref 13.0–17.0)
MCH: 31.4 pg (ref 26.0–34.0)
MCHC: 34.9 g/dL (ref 30.0–36.0)
MCV: 90 fL (ref 78.0–100.0)
Platelets: 358 10*3/uL (ref 150–400)
RBC: 3.09 MIL/uL — ABNORMAL LOW (ref 4.22–5.81)
RDW: 12.3 % (ref 11.5–15.5)
WBC: 7.5 10*3/uL (ref 4.0–10.5)

## 2017-01-10 LAB — MAGNESIUM: Magnesium: 1.8 mg/dL (ref 1.7–2.4)

## 2017-01-10 LAB — PREALBUMIN: Prealbumin: 11.1 mg/dL — ABNORMAL LOW (ref 18–38)

## 2017-01-10 LAB — TRIGLYCERIDES: Triglycerides: 69 mg/dL (ref <150)

## 2017-01-10 MED ORDER — MAGNESIUM SULFATE 2 GM/50ML IV SOLN
2.0000 g | Freq: Once | INTRAVENOUS | Status: AC
Start: 2017-01-10 — End: 2017-01-10
  Administered 2017-01-10: 2 g via INTRAVENOUS
  Filled 2017-01-10: qty 50

## 2017-01-10 MED ORDER — POTASSIUM CHLORIDE 10 MEQ/50ML IV SOLN
10.0000 meq | INTRAVENOUS | Status: AC
  Administered 2017-01-10 (×2): 10 meq via INTRAVENOUS
  Filled 2017-01-10: qty 50

## 2017-01-10 MED ORDER — POTASSIUM CHLORIDE 10 MEQ/50ML IV SOLN
10.0000 meq | INTRAVENOUS | Status: AC
  Filled 2017-01-10: qty 50

## 2017-01-10 MED ORDER — TRAVASOL 10 % IV SOLN
INTRAVENOUS | Status: AC
  Administered 2017-01-10: 18:00:00 via INTRAVENOUS
  Filled 2017-01-10: qty 858

## 2017-01-10 MED ORDER — DEXTROSE-NACL 5-0.45 % IV SOLN
INTRAVENOUS | Status: DC
  Administered 2017-01-14: 08:00:00 via INTRAVENOUS
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
## 2017-01-11 LAB — CBC
HCT: 30.5 % — ABNORMAL LOW (ref 39.0–52.0)
Hemoglobin: 10.1 g/dL — ABNORMAL LOW (ref 13.0–17.0)
MCH: 30.1 pg (ref 26.0–34.0)
MCHC: 33.1 g/dL (ref 30.0–36.0)
MCV: 90.8 fL (ref 78.0–100.0)
PLATELETS: 409 10*3/uL — AB (ref 150–400)
RBC: 3.36 MIL/uL — AB (ref 4.22–5.81)
RDW: 12.1 % (ref 11.5–15.5)
WBC: 7.7 10*3/uL (ref 4.0–10.5)

## 2017-01-11 LAB — MAGNESIUM: Magnesium: 1.9 mg/dL (ref 1.7–2.4)

## 2017-01-11 LAB — BASIC METABOLIC PANEL
ANION GAP: 9 (ref 5–15)
BUN: 11 mg/dL (ref 6–20)
CALCIUM: 8.9 mg/dL (ref 8.9–10.3)
CO2: 22 mmol/L (ref 22–32)
Chloride: 109 mmol/L (ref 101–111)
Creatinine, Ser: 0.92 mg/dL (ref 0.61–1.24)
GFR calc Af Amer: >60 mL/min (ref >60)
GFR calc non Af Amer: >60 mL/min (ref >60)
GLUCOSE: 91 mg/dL (ref 65–99)
POTASSIUM: 4.2 mmol/L (ref 3.5–5.1)
Sodium: 140 mmol/L (ref 135–145)

## 2017-01-11 LAB — GLUCOSE, CAPILLARY
Glucose-Capillary: 103 mg/dL — ABNORMAL HIGH (ref 65–99)
Glucose-Capillary: 104 mg/dL — ABNORMAL HIGH (ref 65–99)

## 2017-01-11 MED ORDER — PROMETHAZINE HCL 25 MG/ML IJ SOLN: 12.5000 mg | Freq: Four times a day (QID) | INTRAMUSCULAR | Status: DC | PRN

## 2017-01-11 MED ORDER — LORAZEPAM 2 MG/ML IJ SOLN
2.0000 mg | Freq: Every evening | INTRAMUSCULAR | Status: DC | PRN
  Administered 2017-01-11 – 2017-01-12 (×2): 2 mg via INTRAVENOUS
  Filled 2017-01-11 (×3): qty 1

## 2017-01-11 MED ORDER — BLISTEX MEDICATED EX OINT
TOPICAL_OINTMENT | CUTANEOUS | Status: DC | PRN
  Filled 2017-01-11: qty 6.3

## 2017-01-11 MED ORDER — TRACE MINERALS CR-CU-MN-SE-ZN 10-1000-500-60 MCG/ML IV SOLN
INTRAVENOUS | Status: DC
  Administered 2017-01-11: 18:00:00 via INTRAVENOUS
  Filled 2017-01-11: qty 1188
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
## 2017-01-12 ENCOUNTER — Inpatient Hospital Stay (HOSPITAL_COMMUNITY): Payer: Self-pay

## 2017-01-12 LAB — COMPREHENSIVE METABOLIC PANEL
ALK PHOS: 41 U/L (ref 38–126)
ALT: 18 U/L (ref 17–63)
ANION GAP: 7 (ref 5–15)
AST: 26 U/L (ref 15–41)
Albumin: 2.6 g/dL — ABNORMAL LOW (ref 3.5–5.0)
BUN: 15 mg/dL (ref 6–20)
CALCIUM: 8.6 mg/dL — AB (ref 8.9–10.3)
CHLORIDE: 108 mmol/L (ref 101–111)
CO2: 24 mmol/L (ref 22–32)
Creatinine, Ser: 0.85 mg/dL (ref 0.61–1.24)
GFR calc Af Amer: >60 mL/min (ref >60)
GFR calc non Af Amer: >60 mL/min (ref >60)
GLUCOSE: 92 mg/dL (ref 65–99)
Potassium: 3.9 mmol/L (ref 3.5–5.1)
SODIUM: 139 mmol/L (ref 135–145)
Total Bilirubin: 0.5 mg/dL (ref 0.3–1.2)
Total Protein: 6.2 g/dL — ABNORMAL LOW (ref 6.5–8.1)

## 2017-01-12 LAB — MAGNESIUM: Magnesium: 1.9 mg/dL (ref 1.7–2.4)

## 2017-01-12 LAB — PHOSPHORUS: PHOSPHORUS: 4.5 mg/dL (ref 2.5–4.6)

## 2017-01-12 LAB — GLUCOSE, CAPILLARY
GLUCOSE-CAPILLARY: 99 mg/dL (ref 65–99)
GLUCOSE-CAPILLARY: 78 mg/dL (ref 65–99)
Glucose-Capillary: 105 mg/dL — ABNORMAL HIGH (ref 65–99)

## 2017-01-12 MED ORDER — IOPAMIDOL (ISOVUE-300) INJECTION 61%
INTRAVENOUS | Status: AC
  Administered 2017-01-12: 09:00:00
  Filled 2017-01-12: qty 150

## 2017-01-12 MED ORDER — TRAVASOL 10 % IV SOLN: INTRAVENOUS | Status: DC

## 2017-01-12 MED ORDER — KETOROLAC TROMETHAMINE 30 MG/ML IJ SOLN: 30.0000 mg | Freq: Four times a day (QID) | INTRAMUSCULAR | Status: DC | PRN

## 2017-01-12 MED ORDER — TRAVASOL 10 % IV SOLN
INTRAVENOUS | Status: AC
  Administered 2017-01-12: 17:00:00 via INTRAVENOUS
  Filled 2017-01-12: qty 594

## 2017-01-12 MED ORDER — IOPAMIDOL (ISOVUE-300) INJECTION 61%
150.0000 mL | Freq: Once | INTRAVENOUS | Status: AC | PRN
  Administered 2017-01-12: 150 mL via ORAL

## 2017-01-12 MED ORDER — TRAVASOL 10 % IV SOLN
INTRAVENOUS | Status: AC
  Filled 2017-01-12: qty 1188

## 2017-01-12 MED ORDER — HYDROCODONE-ACETAMINOPHEN 7.5-325 MG/15ML PO SOLN
10.0000 mL | ORAL | Status: DC | PRN
  Administered 2017-01-12 – 2017-01-13 (×2): 10 mL via ORAL
  Filled 2017-01-12 (×2): qty 15
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
## 2017-01-13 LAB — GLUCOSE, CAPILLARY
Glucose-Capillary: 93 mg/dL (ref 65–99)
Glucose-Capillary: 91 mg/dL (ref 65–99)
Glucose-Capillary: 88 mg/dL (ref 65–99)

## 2017-01-13 MED ORDER — ENSURE ENLIVE PO LIQD
237.0000 mL | Freq: Three times a day (TID) | ORAL | Status: DC
  Administered 2017-01-13: 237 mL via ORAL

## 2017-01-13 MED ORDER — HYDROCODONE-ACETAMINOPHEN 5-325 MG PO TABS
1.0000 | ORAL_TABLET | ORAL | Status: DC | PRN
  Administered 2017-01-13 – 2017-01-14 (×2): 1 via ORAL
  Filled 2017-01-13 (×2): qty 1

## 2017-01-13 MED ORDER — BOOST / RESOURCE BREEZE PO LIQD CUSTOM
1.0000 | Freq: Three times a day (TID) | ORAL | Status: DC
  Administered 2017-01-13: 1 via ORAL
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
## 2017-01-13 NOTE — Progress Notes (Signed)
ST consulted regarding pt.'s diet. He would like to eat regular food. SW consult put in per pt request.

## 2017-01-14 LAB — GLUCOSE, CAPILLARY: Glucose-Capillary: 79 mg/dL (ref 65–99)

## 2017-01-14 LAB — BASIC METABOLIC PANEL
ANION GAP: 8 (ref 5–15)
BUN: 16 mg/dL (ref 6–20)
CHLORIDE: 104 mmol/L (ref 101–111)
CO2: 25 mmol/L (ref 22–32)
Calcium: 8.3 mg/dL — ABNORMAL LOW (ref 8.9–10.3)
Creatinine, Ser: 0.86 mg/dL (ref 0.61–1.24)
GFR calc Af Amer: >60 mL/min (ref >60)
GFR calc non Af Amer: >60 mL/min (ref >60)
GLUCOSE: 87 mg/dL (ref 65–99)
POTASSIUM: 3.8 mmol/L (ref 3.5–5.1)
Sodium: 137 mmol/L (ref 135–145)

## 2017-01-14 MED ORDER — HYDROCODONE-ACETAMINOPHEN 5-325 MG PO TABS: 1.0000 | ORAL_TABLET | ORAL | 0 refills | Status: DC | PRN

## 2017-01-14 NOTE — Progress Notes (Signed)
CSW met with patient and girlfriend, Alexander Yates at bedside for further questions regarding obtaining Medicaid. Patient informed CSW that a lady from [place] came yesterday to visit him, informed him that he was covered for this hospitalization through Emergency Medicaid but would need to take legal action for child support against the mother of his two children who resides in [place] before he could receive full Medicaid benefits. Patient stated that he signed documentation that DSS worker took with her, he provided worker with contact information for his mother for follow up. Patient and spouse thankful for CSW interaction. Patient stated that he feels safe going back home and is delighted to be discharged today.   CSW signing off.  Madilyn Fireman, MSW, LCSW-A Weekend Clinical Social Worker [phone]

## 2017-01-14 NOTE — Discharge Instructions (Signed)
Keep trach capped until f/u IF airway distress uncap and proceed to ER / call 911
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
## 2017-01-16 ENCOUNTER — Encounter (HOSPITAL_COMMUNITY): Payer: Self-pay | Admitting: Emergency Medicine

## 2017-01-31 ENCOUNTER — Emergency Department (HOSPITAL_COMMUNITY)
Admission: EM | Admit: 2017-01-31 | Discharge: 2017-01-31 | Disposition: A | Discharge status: Home / Self Care | Payer: Self-pay | Attending: Emergency Medicine | Admitting: Emergency Medicine

## 2017-01-31 ENCOUNTER — Encounter (HOSPITAL_COMMUNITY): Payer: Self-pay

## 2017-01-31 ENCOUNTER — Emergency Department (HOSPITAL_COMMUNITY): Payer: Self-pay

## 2017-01-31 DIAGNOSIS — G8911 Acute pain due to trauma: Secondary | ICD-10-CM | POA: Insufficient documentation

## 2017-01-31 DIAGNOSIS — Z930 Tracheostomy status: Secondary | ICD-10-CM | POA: Insufficient documentation

## 2017-01-31 DIAGNOSIS — F17210 Nicotine dependence, cigarettes, uncomplicated: Secondary | ICD-10-CM | POA: Insufficient documentation

## 2017-01-31 DIAGNOSIS — M25512 Pain in left shoulder: Secondary | ICD-10-CM | POA: Insufficient documentation

## 2017-01-31 DIAGNOSIS — J4530 Mild persistent asthma, uncomplicated: Secondary | ICD-10-CM | POA: Insufficient documentation

## 2017-01-31 LAB — COMPREHENSIVE METABOLIC PANEL
ALBUMIN: 3.3 g/dL — AB (ref 3.5–5.0)
ALK PHOS: 53 U/L (ref 38–126)
ALT: 18 U/L (ref 17–63)
AST: 28 U/L (ref 15–41)
Anion gap: 8 (ref 5–15)
BILIRUBIN TOTAL: 0.9 mg/dL (ref 0.3–1.2)
BUN: 13 mg/dL (ref 6–20)
CALCIUM: 8.8 mg/dL — AB (ref 8.9–10.3)
CO2: 27 mmol/L (ref 22–32)
CREATININE: 1.02 mg/dL (ref 0.61–1.24)
Chloride: 106 mmol/L (ref 101–111)
GFR calc Af Amer: >60 mL/min (ref >60)
GFR calc non Af Amer: >60 mL/min (ref >60)
GLUCOSE: 92 mg/dL (ref 65–99)
Potassium: 4.1 mmol/L (ref 3.5–5.1)
Sodium: 141 mmol/L (ref 135–145)
Total Protein: 6.4 g/dL — ABNORMAL LOW (ref 6.5–8.1)

## 2017-01-31 LAB — CBC WITH DIFFERENTIAL/PLATELET
Basophils Absolute: 0 10*3/uL (ref 0.0–0.1)
Basophils Relative: 0 %
EOS ABS: 0.3 10*3/uL (ref 0.0–0.7)
EOS PCT: 3 %
HEMATOCRIT: 35.5 % — AB (ref 39.0–52.0)
Hemoglobin: 11.4 g/dL — ABNORMAL LOW (ref 13.0–17.0)
Lymphocytes Relative: 12 %
Lymphs Abs: 1.1 10*3/uL (ref 0.7–4.0)
MCH: 30.7 pg (ref 26.0–34.0)
MCHC: 32.1 g/dL (ref 30.0–36.0)
MCV: 95.7 fL (ref 78.0–100.0)
MONO ABS: 1 10*3/uL (ref 0.1–1.0)
MONOS PCT: 10 %
NEUTROS ABS: 7.4 10*3/uL (ref 1.7–7.7)
Neutrophils Relative %: 75 %
PLATELETS: 271 10*3/uL (ref 150–400)
RBC: 3.71 MIL/uL — ABNORMAL LOW (ref 4.22–5.81)
RDW: 12.9 % (ref 11.5–15.5)
WBC: 9.8 10*3/uL (ref 4.0–10.5)

## 2017-01-31 LAB — I-STAT CG4 LACTIC ACID, ED: Lactic Acid, Venous: 1.02 mmol/L (ref 0.5–1.9)

## 2017-01-31 MED ORDER — HYDROCODONE-ACETAMINOPHEN 5-325 MG PO TABS: 1.0000 | ORAL_TABLET | Freq: Four times a day (QID) | ORAL | 0 refills | Status: DC | PRN

## 2017-01-31 NOTE — ED Notes (Addendum)
Called pt for triage did not answer 

## 2017-01-31 NOTE — Discharge Instructions (Signed)
Please read instructions below. Apply ice to your shoulder for 20 minutes at a time. You can take hydrocodone every 6 hours as needed for pain. You can take advil/ibuprofen with this medication for additional pain relief. Schedule an appointment with the orthopedic specialist to follow-up on your injury. Return to the ER for new or concerning symptoms.

## 2017-01-31 NOTE — Progress Notes (Signed)
Orthopedic Tech Progress Note Patient Details:  Alexander ShileyChristopher D Yates Aug 08, 1990 [id number]  Ortho Devices Type of Ortho Device: Arm sling Ortho Device/Splint Location: LUE Ortho Device/Splint Interventions: Ordered, Application   Post Interventions Patient Tolerated: Well Instructions Provided: Care of device   Alexander MoccasinHughes, Alexander Giammarco Craig 01/31/2017, 10:13 PM

## 2017-01-31 NOTE — ED Notes (Signed)
Ortho tech at bedside to apply sling as ordered.

## 2017-01-31 NOTE — ED Notes (Signed)
Pt is assessed and discharged by [place] EDPA.
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
## 2017-01-31 NOTE — ED Notes (Signed)
Patient back from xray. Results reviewed.
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
## 2017-02-08 ENCOUNTER — Ambulatory Visit (INDEPENDENT_AMBULATORY_CARE_PROVIDER_SITE_OTHER): Payer: Self-pay | Admitting: Orthopedic Surgery

## 2017-02-08 ENCOUNTER — Encounter (INDEPENDENT_AMBULATORY_CARE_PROVIDER_SITE_OTHER): Payer: Self-pay | Admitting: Orthopedic Surgery

## 2017-02-08 DIAGNOSIS — S42032B Displaced fracture of lateral end of left clavicle, initial encounter for open fracture: Secondary | ICD-10-CM

## 2017-02-08 MED ORDER — TRAMADOL HCL 50 MG PO TABS: ORAL_TABLET | ORAL | 0 refills | Status: DC

## 2017-02-08 MED ORDER — MELOXICAM 15 MG PO TABS: ORAL_TABLET | ORAL | 0 refills | Status: DC

## 2017-02-11 ENCOUNTER — Encounter (INDEPENDENT_AMBULATORY_CARE_PROVIDER_SITE_OTHER): Payer: Self-pay | Admitting: Orthopedic Surgery
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
## 2017-02-15 ENCOUNTER — Ambulatory Visit (INDEPENDENT_AMBULATORY_CARE_PROVIDER_SITE_OTHER): Payer: Self-pay | Admitting: Orthopedic Surgery

## 2017-02-27 ENCOUNTER — Ambulatory Visit (INDEPENDENT_AMBULATORY_CARE_PROVIDER_SITE_OTHER): Payer: Self-pay | Admitting: Orthopedic Surgery

## 2017-03-06 ENCOUNTER — Ambulatory Visit (INDEPENDENT_AMBULATORY_CARE_PROVIDER_SITE_OTHER): Payer: Self-pay

## 2017-03-06 ENCOUNTER — Encounter (INDEPENDENT_AMBULATORY_CARE_PROVIDER_SITE_OTHER): Payer: Self-pay | Admitting: Orthopedic Surgery

## 2017-03-06 ENCOUNTER — Ambulatory Visit (INDEPENDENT_AMBULATORY_CARE_PROVIDER_SITE_OTHER): Payer: Self-pay | Admitting: Orthopedic Surgery

## 2017-03-06 DIAGNOSIS — Z09 Encounter for follow-up examination after completed treatment for conditions other than malignant neoplasm: Secondary | ICD-10-CM

## 2017-03-06 DIAGNOSIS — S42032B Displaced fracture of lateral end of left clavicle, initial encounter for open fracture: Secondary | ICD-10-CM
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
## 2017-03-06 NOTE — Addendum Note (Signed)
Addended byPrescott Parma: Jeshawn Melucci on: 03/06/2017 02:39 PM   Modules accepted: Orders

## 2017-03-07 ENCOUNTER — Other Ambulatory Visit (INDEPENDENT_AMBULATORY_CARE_PROVIDER_SITE_OTHER): Payer: Self-pay | Admitting: Orthopedic Surgery

## 2017-03-07 DIAGNOSIS — W3400XD Accidental discharge from unspecified firearms or gun, subsequent encounter: Secondary | ICD-10-CM

## 2017-03-07 DIAGNOSIS — S41032D Puncture wound without foreign body of left shoulder, subsequent encounter: Principal | ICD-10-CM

## 2017-03-09 NOTE — Pre-Procedure Instructions (Signed)
Alexander ShileyChristopher D Yates  03/09/2017      [facility] [id number] [place]- [place], [place] - [address] AT [facility] [address] [place] [address] Phone: [phone] Fax: [phone]  [facility] [address], [place] - [id number] [place] [address] Phone: [phone] Fax: [phone]    Your procedure is scheduled on Tuesday March 12.  [facility] Admitting at 11:20 A.M.  Call this number if you have problems the morning of surgery:  [phone]   Remember:  Do not eat food or drink liquids after midnight.  Take these medicines the morning of surgery with A SIP OF WATER: tramadol (ultram) if needed  7 days prior to surgery STOP taking any Aspirin(unless otherwise instructed by your surgeon), Meloxicam (mobic), Aleve, Naproxen, Ibuprofen, Motrin, Advil, Goody's, BC's, all herbal medications, fish oil, and all vitamins    Do not wear jewelry, make-up or nail polish.  Do not wear lotions, powders, or perfumes, or deodorant.  Do not shave 48 hours prior to surgery.  Men may shave face and neck.  Do not bring valuables to the hospital.  [facility] is not responsible for any belongings or valuables.  Contacts, dentures or bridgework may not be worn into surgery.  Leave your suitcase in the car.  After surgery it may be brought to your room.  For patients admitted to the hospital, discharge time will be determined by your treatment team.  Patients discharged the day of surgery will not be allowed to drive home.    Special instructions:    [facility]- Preparing For Surgery  Before surgery, you can play an important role. Because skin is not sterile, your skin needs to be as free of germs as possible. You can reduce the number of germs on your skin by washing with CHG (chlorahexidine gluconate) Soap before surgery.  CHG is an antiseptic cleaner which kills germs  and bonds with the skin to continue killing germs even after washing.  Please do not use if you have an allergy to CHG or antibacterial soaps. If your skin becomes reddened/irritated stop using the CHG.  Do not shave (including legs and underarms) for at least 48 hours prior to first CHG shower. It is OK to shave your face.  Please follow these instructions carefully.   1. Shower the NIGHT BEFORE SURGERY and the MORNING OF SURGERY with CHG.   2. If you chose to wash your hair, wash your hair first as usual with your normal shampoo.  3. After you shampoo, rinse your hair and body thoroughly to remove the shampoo.  4. Use CHG as you would any other liquid soap. You can apply CHG directly to the skin and wash gently with a scrungie or a clean washcloth.   5. Apply the CHG Soap to your body ONLY FROM THE NECK DOWN.  Do not use on open wounds or open sores. Avoid contact with your eyes, ears, mouth and genitals (private parts). Wash Face and genitals (private parts)  with your normal soap.  6. Wash thoroughly, paying special attention to the area where your surgery will be performed.  7. Thoroughly rinse your body with warm water from the neck down.  8. DO NOT shower/wash with your normal soap after using and rinsing off the CHG Soap.  9. Pat yourself dry with a CLEAN TOWEL.  10. Wear CLEAN PAJAMAS  to bed the night before surgery, wear comfortable clothes the morning of surgery  11. Place CLEAN SHEETS on your bed the night of your first shower and DO NOT SLEEP WITH PETS.    Day of Surgery: Do not apply any deodorants/lotions. Please wear clean clothes to the hospital/surgery center.      Please read over the following fact sheets that you were given. Coughing and Deep Breathing and Surgical Site Infection Prevention

## 2017-03-10 ENCOUNTER — Encounter (HOSPITAL_COMMUNITY): Payer: Self-pay

## 2017-03-10 ENCOUNTER — Other Ambulatory Visit: Payer: Self-pay

## 2017-03-10 ENCOUNTER — Encounter (HOSPITAL_COMMUNITY)
Admission: RE | Admit: 2017-03-10 | Discharge: 2017-03-10 | Disposition: A | Discharge status: Home / Self Care | Payer: Self-pay | Source: Ambulatory Visit | Attending: Orthopedic Surgery | Admitting: Orthopedic Surgery

## 2017-03-10 DIAGNOSIS — S41002A Unspecified open wound of left shoulder, initial encounter: Secondary | ICD-10-CM | POA: Insufficient documentation

## 2017-03-10 DIAGNOSIS — W3400XA Accidental discharge from unspecified firearms or gun, initial encounter: Secondary | ICD-10-CM | POA: Insufficient documentation

## 2017-03-10 DIAGNOSIS — Z01810 Encounter for preprocedural cardiovascular examination: Secondary | ICD-10-CM | POA: Insufficient documentation

## 2017-03-10 DIAGNOSIS — Z01812 Encounter for preprocedural laboratory examination: Secondary | ICD-10-CM | POA: Insufficient documentation

## 2017-03-10 LAB — CBC
HEMATOCRIT: 38.9 % — AB (ref 39.0–52.0)
HEMOGLOBIN: 12.9 g/dL — AB (ref 13.0–17.0)
MCH: 30.8 pg (ref 26.0–34.0)
MCHC: 33.2 g/dL (ref 30.0–36.0)
MCV: 92.8 fL (ref 78.0–100.0)
Platelets: 263 10*3/uL (ref 150–400)
RBC: 4.19 MIL/uL — ABNORMAL LOW (ref 4.22–5.81)
RDW: 12.7 % (ref 11.5–15.5)
WBC: 4.1 10*3/uL (ref 4.0–10.5)

## 2017-03-11 ENCOUNTER — Emergency Department (HOSPITAL_COMMUNITY)
Admission: EM | Admit: 2017-03-11 | Discharge: 2017-03-11 | Disposition: A | Discharge status: Home / Self Care | Payer: Self-pay | Attending: Emergency Medicine | Admitting: Emergency Medicine

## 2017-03-11 ENCOUNTER — Emergency Department (HOSPITAL_COMMUNITY): Payer: Self-pay

## 2017-03-11 ENCOUNTER — Inpatient Hospital Stay: Admission: RE | Admit: 2017-03-11 | Payer: Self-pay | Source: Ambulatory Visit

## 2017-03-11 ENCOUNTER — Encounter (HOSPITAL_COMMUNITY): Payer: Self-pay | Admitting: Emergency Medicine

## 2017-03-11 DIAGNOSIS — J45909 Unspecified asthma, uncomplicated: Secondary | ICD-10-CM | POA: Insufficient documentation

## 2017-03-11 DIAGNOSIS — M546 Pain in thoracic spine: Secondary | ICD-10-CM

## 2017-03-11 DIAGNOSIS — M25522 Pain in left elbow: Secondary | ICD-10-CM | POA: Insufficient documentation

## 2017-03-11 DIAGNOSIS — M542 Cervicalgia: Secondary | ICD-10-CM | POA: Insufficient documentation

## 2017-03-11 DIAGNOSIS — M549 Dorsalgia, unspecified: Secondary | ICD-10-CM | POA: Insufficient documentation

## 2017-03-11 DIAGNOSIS — F17210 Nicotine dependence, cigarettes, uncomplicated: Secondary | ICD-10-CM | POA: Insufficient documentation

## 2017-03-11 MED ORDER — IBUPROFEN 800 MG PO TABS
800.0000 mg | ORAL_TABLET | Freq: Three times a day (TID) | ORAL | 0 refills | Status: DC
Start: 2017-03-11 — End: 2017-03-14

## 2017-03-11 MED ORDER — CYCLOBENZAPRINE HCL 10 MG PO TABS: 10.0000 mg | ORAL_TABLET | Freq: Two times a day (BID) | ORAL | 0 refills | Status: DC | PRN
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
## 2017-03-11 NOTE — Discharge Instructions (Signed)
Take the prescribed medication as directed.  You may be sore for the next few days. Follow-up with your primary care doctor. Return to the ED for new or worsening symptoms.

## 2017-03-11 NOTE — ED Notes (Signed)
Returned from xray

## 2017-03-14 ENCOUNTER — Encounter (HOSPITAL_COMMUNITY): Payer: Self-pay

## 2017-03-14 ENCOUNTER — Ambulatory Visit (HOSPITAL_COMMUNITY)
Admission: RE | Admit: 2017-03-14 | Discharge: 2017-03-14 | Disposition: A | Discharge status: Home / Self Care | Payer: Self-pay | Source: Ambulatory Visit | Attending: Orthopedic Surgery | Admitting: Orthopedic Surgery

## 2017-03-14 ENCOUNTER — Ambulatory Visit (HOSPITAL_COMMUNITY): Payer: Self-pay | Admitting: Anesthesiology

## 2017-03-14 ENCOUNTER — Other Ambulatory Visit: Payer: Self-pay

## 2017-03-14 ENCOUNTER — Encounter (HOSPITAL_COMMUNITY)
Admission: RE | Disposition: A | Discharge status: Home / Self Care | Payer: Self-pay | Source: Ambulatory Visit | Attending: Orthopedic Surgery

## 2017-03-14 DIAGNOSIS — S41032A Puncture wound without foreign body of left shoulder, initial encounter: Secondary | ICD-10-CM | POA: Insufficient documentation

## 2017-03-14 DIAGNOSIS — W3400XD Accidental discharge from unspecified firearms or gun, subsequent encounter: Secondary | ICD-10-CM

## 2017-03-14 DIAGNOSIS — S41032D Puncture wound without foreign body of left shoulder, subsequent encounter: Secondary | ICD-10-CM

## 2017-03-14 DIAGNOSIS — A4901 Methicillin susceptible Staphylococcus aureus infection, unspecified site: Secondary | ICD-10-CM | POA: Insufficient documentation

## 2017-03-14 DIAGNOSIS — S41002A Unspecified open wound of left shoulder, initial encounter: Secondary | ICD-10-CM

## 2017-03-14 DIAGNOSIS — M8660 Other chronic osteomyelitis, unspecified site: Secondary | ICD-10-CM | POA: Insufficient documentation

## 2017-03-14 DIAGNOSIS — Z8709 Personal history of other diseases of the respiratory system: Secondary | ICD-10-CM | POA: Insufficient documentation

## 2017-03-14 DIAGNOSIS — Z79899 Other long term (current) drug therapy: Secondary | ICD-10-CM | POA: Insufficient documentation

## 2017-03-14 DIAGNOSIS — Z791 Long term (current) use of non-steroidal anti-inflammatories (NSAID): Secondary | ICD-10-CM | POA: Insufficient documentation

## 2017-03-14 DIAGNOSIS — W3400XA Accidental discharge from unspecified firearms or gun, initial encounter: Secondary | ICD-10-CM | POA: Insufficient documentation

## 2017-03-14 DIAGNOSIS — Y248XXA Other firearm discharge, undetermined intent, initial encounter: Secondary | ICD-10-CM

## 2017-03-14 DIAGNOSIS — F17210 Nicotine dependence, cigarettes, uncomplicated: Secondary | ICD-10-CM | POA: Insufficient documentation

## 2017-03-14 HISTORY — PX: I&D EXTREMITY: SHX5045

## 2017-03-14 LAB — COMPREHENSIVE METABOLIC PANEL
ALT: 15 U/L — AB (ref 17–63)
ANION GAP: 8 (ref 5–15)
AST: 25 U/L (ref 15–41)
Albumin: 3.4 g/dL — ABNORMAL LOW (ref 3.5–5.0)
Alkaline Phosphatase: 52 U/L (ref 38–126)
BUN: 15 mg/dL (ref 6–20)
CHLORIDE: 108 mmol/L (ref 101–111)
CO2: 23 mmol/L (ref 22–32)
CREATININE: 0.93 mg/dL (ref 0.61–1.24)
Calcium: 8.8 mg/dL — ABNORMAL LOW (ref 8.9–10.3)
Glucose, Bld: 84 mg/dL (ref 65–99)
Potassium: 3.9 mmol/L (ref 3.5–5.1)
SODIUM: 139 mmol/L (ref 135–145)
Total Bilirubin: 0.8 mg/dL (ref 0.3–1.2)
Total Protein: 6.4 g/dL — ABNORMAL LOW (ref 6.5–8.1)

## 2017-03-14 LAB — CBC
HCT: 42.6 % (ref 39.0–52.0)
Hemoglobin: 14 g/dL (ref 13.0–17.0)
MCH: 31 pg (ref 26.0–34.0)
MCHC: 32.9 g/dL (ref 30.0–36.0)
MCV: 94.5 fL (ref 78.0–100.0)
PLATELETS: 267 10*3/uL (ref 150–400)
RBC: 4.51 MIL/uL (ref 4.22–5.81)
RDW: 12.6 % (ref 11.5–15.5)
WBC: 5.2 10*3/uL (ref 4.0–10.5)

## 2017-03-14 SURGERY — IRRIGATION AND DEBRIDEMENT EXTREMITY
Anesthesia: General | Laterality: Left

## 2017-03-14 MED ORDER — PHENYLEPHRINE 40 MCG/ML (10ML) SYRINGE FOR IV PUSH (FOR BLOOD PRESSURE SUPPORT)
PREFILLED_SYRINGE | INTRAVENOUS | Status: DC | PRN
  Administered 2017-03-14: 80 ug via INTRAVENOUS
  Administered 2017-03-14 (×2): 40 ug via INTRAVENOUS
  Administered 2017-03-14: 80 ug via INTRAVENOUS
  Administered 2017-03-14 (×4): 40 ug via INTRAVENOUS

## 2017-03-14 MED ORDER — PHENYLEPHRINE 40 MCG/ML (10ML) SYRINGE FOR IV PUSH (FOR BLOOD PRESSURE SUPPORT)
PREFILLED_SYRINGE | INTRAVENOUS | Status: AC
Start: 2017-03-14 — End: 2017-03-14
  Filled 2017-03-14: qty 10

## 2017-03-14 MED ORDER — CHLORHEXIDINE GLUCONATE 4 % EX LIQD: 60.0000 mL | Freq: Once | CUTANEOUS | Status: DC

## 2017-03-14 MED ORDER — BUPIVACAINE HCL (PF) 0.25 % IJ SOLN
INTRAMUSCULAR | Status: AC
Start: 2017-03-14 — End: 2017-03-14
  Filled 2017-03-14: qty 30

## 2017-03-14 MED ORDER — VANCOMYCIN HCL 500 MG IV SOLR
INTRAVENOUS | Status: AC
  Filled 2017-03-14: qty 500

## 2017-03-14 MED ORDER — ONDANSETRON HCL 4 MG/2ML IJ SOLN
INTRAMUSCULAR | Status: DC | PRN
  Administered 2017-03-14: 4 mg via INTRAVENOUS

## 2017-03-14 MED ORDER — MEPERIDINE HCL 50 MG/ML IJ SOLN: 6.2500 mg | INTRAMUSCULAR | Status: DC | PRN

## 2017-03-14 MED ORDER — SODIUM CHLORIDE 0.9 % IR SOLN
Status: DC | PRN
  Administered 2017-03-14 (×3): 1000 mL

## 2017-03-14 MED ORDER — DEXAMETHASONE SODIUM PHOSPHATE 10 MG/ML IJ SOLN
INTRAMUSCULAR | Status: DC | PRN
  Administered 2017-03-14: 10 mg via INTRAVENOUS

## 2017-03-14 MED ORDER — MIDAZOLAM HCL 2 MG/2ML IJ SOLN
INTRAMUSCULAR | Status: AC
  Filled 2017-03-14: qty 2

## 2017-03-14 MED ORDER — LIDOCAINE 2% (20 MG/ML) 5 ML SYRINGE
INTRAMUSCULAR | Status: DC | PRN
  Administered 2017-03-14: 100 mg via INTRAVENOUS

## 2017-03-14 MED ORDER — CEFAZOLIN SODIUM 1 G IJ SOLR
INTRAMUSCULAR | Status: AC
Start: 2017-03-14 — End: 2017-03-14
  Filled 2017-03-14: qty 20

## 2017-03-14 MED ORDER — LACTATED RINGERS IV SOLN
INTRAVENOUS | Status: DC
  Administered 2017-03-14: 12:00:00 via INTRAVENOUS

## 2017-03-14 MED ORDER — ROCURONIUM BROMIDE 100 MG/10ML IV SOLN
INTRAVENOUS | Status: DC | PRN
  Administered 2017-03-14: 50 mg via INTRAVENOUS

## 2017-03-14 MED ORDER — MORPHINE SULFATE (PF) 4 MG/ML IV SOLN
INTRAVENOUS | Status: DC | PRN
  Administered 2017-03-14: 8 mg via INTRAVENOUS

## 2017-03-14 MED ORDER — FENTANYL CITRATE (PF) 100 MCG/2ML IJ SOLN
INTRAMUSCULAR | Status: DC | PRN
  Administered 2017-03-14: 100 ug via INTRAVENOUS

## 2017-03-14 MED ORDER — PROMETHAZINE HCL 25 MG/ML IJ SOLN: 6.2500 mg | INTRAMUSCULAR | Status: DC | PRN

## 2017-03-14 MED ORDER — DEXAMETHASONE SODIUM PHOSPHATE 10 MG/ML IJ SOLN
INTRAMUSCULAR | Status: AC
Start: 2017-03-14 — End: 2017-03-14
  Filled 2017-03-14: qty 1

## 2017-03-14 MED ORDER — MORPHINE SULFATE (PF) 4 MG/ML IV SOLN
INTRAVENOUS | Status: AC
Start: 2017-03-14 — End: 2017-03-14
  Filled 2017-03-14: qty 2

## 2017-03-14 MED ORDER — CLONIDINE HCL (ANALGESIA) 100 MCG/ML EP SOLN
EPIDURAL | Status: DC | PRN
  Administered 2017-03-14: 1 mL

## 2017-03-14 MED ORDER — PROPOFOL 10 MG/ML IV BOLUS
INTRAVENOUS | Status: DC | PRN
  Administered 2017-03-14: 200 mg via INTRAVENOUS

## 2017-03-14 MED ORDER — ONDANSETRON HCL 4 MG/2ML IJ SOLN
INTRAMUSCULAR | Status: AC
Start: 2017-03-14 — End: 2017-03-14
  Filled 2017-03-14: qty 2

## 2017-03-14 MED ORDER — OXYCODONE HCL 5 MG PO TABS: 5.0000 mg | ORAL_TABLET | Freq: Once | ORAL | Status: DC | PRN

## 2017-03-14 MED ORDER — CLONIDINE HCL (ANALGESIA) 100 MCG/ML EP SOLN
150.0000 ug | EPIDURAL | Status: DC
  Filled 2017-03-14: qty 10

## 2017-03-14 MED ORDER — SUGAMMADEX SODIUM 200 MG/2ML IV SOLN
INTRAVENOUS | Status: DC | PRN
  Administered 2017-03-14: 200 mg via INTRAVENOUS

## 2017-03-14 MED ORDER — MIDAZOLAM HCL 5 MG/5ML IJ SOLN
INTRAMUSCULAR | Status: DC | PRN
  Administered 2017-03-14: 2 mg via INTRAVENOUS

## 2017-03-14 MED ORDER — PROPOFOL 10 MG/ML IV BOLUS
INTRAVENOUS | Status: AC
  Filled 2017-03-14: qty 20

## 2017-03-14 MED ORDER — HYDROMORPHONE HCL 1 MG/ML IJ SOLN: 0.2500 mg | INTRAMUSCULAR | Status: DC | PRN

## 2017-03-14 MED ORDER — SUGAMMADEX SODIUM 200 MG/2ML IV SOLN
INTRAVENOUS | Status: AC
Start: 2017-03-14 — End: 2017-03-14
  Filled 2017-03-14: qty 2

## 2017-03-14 MED ORDER — VANCOMYCIN HCL 500 MG IV SOLR
INTRAVENOUS | Status: DC | PRN
  Administered 2017-03-14: 500 mg via TOPICAL

## 2017-03-14 MED ORDER — BUPIVACAINE HCL (PF) 0.25 % IJ SOLN
INTRAMUSCULAR | Status: DC | PRN
  Administered 2017-03-14: 20 mL

## 2017-03-14 MED ORDER — CEFAZOLIN SODIUM-DEXTROSE 2-3 GM-%(50ML) IV SOLR
INTRAVENOUS | Status: DC | PRN
  Administered 2017-03-14: 2 g via INTRAVENOUS

## 2017-03-14 MED ORDER — OXYCODONE HCL 5 MG/5ML PO SOLN: 5.0000 mg | Freq: Once | ORAL | Status: DC | PRN

## 2017-03-14 MED ORDER — FENTANYL CITRATE (PF) 250 MCG/5ML IJ SOLN
INTRAMUSCULAR | Status: AC
  Filled 2017-03-14: qty 5

## 2017-03-14 SURGICAL SUPPLY — 63 items
ALCOHOL 70% 16 OZ (MISCELLANEOUS) ×3 IMPLANT
BANDAGE ACE 4X5 VEL STRL LF (GAUZE/BANDAGES/DRESSINGS) IMPLANT
BNDG COHESIVE 4X5 TAN STRL (GAUZE/BANDAGES/DRESSINGS) IMPLANT
BNDG GAUZE ELAST 4 BULKY (GAUZE/BANDAGES/DRESSINGS) IMPLANT
CONT SPECI 4OZ STER CLIK (MISCELLANEOUS) ×6 IMPLANT
COVER SURGICAL LIGHT HANDLE (MISCELLANEOUS) ×3 IMPLANT
CUFF TOURNIQUET SINGLE 18IN (TOURNIQUET CUFF) IMPLANT
CUFF TOURNIQUET SINGLE 24IN (TOURNIQUET CUFF) IMPLANT
CUFF TOURNIQUET SINGLE 34IN LL (TOURNIQUET CUFF) IMPLANT
CUFF TOURNIQUET SINGLE 44IN (TOURNIQUET CUFF) IMPLANT
DECANTER SPIKE VIAL GLASS SM (MISCELLANEOUS) ×3 IMPLANT
DRAPE U-SHAPE 47X51 STRL (DRAPES) ×3 IMPLANT
DRESSING AQUACEL AG SP 3.5X6 (GAUZE/BANDAGES/DRESSINGS) ×1 IMPLANT
DRSG AQUACEL AG SP 3.5X6 (GAUZE/BANDAGES/DRESSINGS) ×3
DRSG PAD ABDOMINAL 8X10 ST (GAUZE/BANDAGES/DRESSINGS) IMPLANT
DURAPREP 26ML APPLICATOR (WOUND CARE) ×3 IMPLANT
ELECT REM PT RETURN 9FT ADLT (ELECTROSURGICAL) ×3
ELECTRODE REM PT RTRN 9FT ADLT (ELECTROSURGICAL) ×1 IMPLANT
GAUZE SPONGE 4X4 12PLY STRL (GAUZE/BANDAGES/DRESSINGS) IMPLANT
GAUZE XEROFORM 5X9 LF (GAUZE/BANDAGES/DRESSINGS) IMPLANT
GLOVE BIOGEL PI IND STRL 7.5 (GLOVE) ×1 IMPLANT
GLOVE BIOGEL PI IND STRL 8 (GLOVE) ×1 IMPLANT
GLOVE BIOGEL PI INDICATOR 7.5 (GLOVE) ×2
GLOVE BIOGEL PI INDICATOR 8 (GLOVE) ×2
GLOVE ECLIPSE 7.0 STRL STRAW (GLOVE) ×3 IMPLANT
GLOVE SURG ORTHO 8.0 STRL STRW (GLOVE) ×3 IMPLANT
GOWN STRL REUS W/ TWL LRG LVL3 (GOWN DISPOSABLE) ×2 IMPLANT
GOWN STRL REUS W/ TWL XL LVL3 (GOWN DISPOSABLE) ×1 IMPLANT
GOWN STRL REUS W/TWL LRG LVL3 (GOWN DISPOSABLE) ×4
GOWN STRL REUS W/TWL XL LVL3 (GOWN DISPOSABLE) ×2
HANDPIECE INTERPULSE COAX TIP (DISPOSABLE)
KIT BASIN OR (CUSTOM PROCEDURE TRAY) ×3 IMPLANT
KIT ROOM TURNOVER OR (KITS) ×3 IMPLANT
MANIFOLD NEPTUNE II (INSTRUMENTS) ×3 IMPLANT
NDL SAFETY ECLIPSE 18X1.5 (NEEDLE) ×2 IMPLANT
NEEDLE HYPO 18GX1.5 SHARP (NEEDLE) ×4
NS IRRIG 1000ML POUR BTL (IV SOLUTION) ×3 IMPLANT
PACK ORTHO EXTREMITY (CUSTOM PROCEDURE TRAY) ×3 IMPLANT
PAD ARMBOARD 7.5X6 YLW CONV (MISCELLANEOUS) ×6 IMPLANT
PAD CAST 4YDX4 CTTN HI CHSV (CAST SUPPLIES) IMPLANT
PADDING CAST COTTON 4X4 STRL (CAST SUPPLIES)
SET HNDPC FAN SPRY TIP SCT (DISPOSABLE) IMPLANT
SLING ARM IMMOBILIZER MED (SOFTGOODS) ×3 IMPLANT
SPONGE LAP 18X18 X RAY DECT (DISPOSABLE) IMPLANT
SPONGE LAP 4X18 X RAY DECT (DISPOSABLE) IMPLANT
STOCKINETTE IMPERVIOUS 9X36 MD (GAUZE/BANDAGES/DRESSINGS) IMPLANT
SUT ETHILON 2 0 FS 18 (SUTURE) IMPLANT
SUT ETHILON 3 0 PS 1 (SUTURE) ×3 IMPLANT
SUT VIC AB 0 CT1 27 (SUTURE) ×2
SUT VIC AB 0 CT1 27XBRD ANBCTR (SUTURE) ×1 IMPLANT
SUT VIC AB 2-0 CT1 27 (SUTURE) ×2
SUT VIC AB 2-0 CT1 TAPERPNT 27 (SUTURE) ×1 IMPLANT
SUT VIC AB 3-0 SH 27 (SUTURE)
SUT VIC AB 3-0 SH 27X BRD (SUTURE) IMPLANT
SWAB CULTURE ESWAB REG 1ML (MISCELLANEOUS) IMPLANT
SWAB CULTURE LIQ STUART DBL (MISCELLANEOUS) IMPLANT
TOWEL OR 17X24 6PK STRL BLUE (TOWEL DISPOSABLE) ×3 IMPLANT
TOWEL OR 17X26 10 PK STRL BLUE (TOWEL DISPOSABLE) ×3 IMPLANT
TUBE CONNECTING 12'X1/4 (SUCTIONS) ×1
TUBE CONNECTING 12X1/4 (SUCTIONS) ×2 IMPLANT
UNDERPAD 30X30 (UNDERPADS AND DIAPERS) ×3 IMPLANT
WATER STERILE IRR 1000ML POUR (IV SOLUTION) ×3 IMPLANT
YANKAUER SUCT BULB TIP NO VENT (SUCTIONS) ×3 IMPLANT
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
## 2017-03-14 NOTE — Transfer of Care (Signed)
Immediate Anesthesia Transfer of Care Note  Patient: Alexander Yates  Procedure(s) Performed: LEFT SHOULDER OPEN DEBRIDEMENT (Left )  Patient Location: PACU  Anesthesia Type:General  Level of Consciousness: awake  Airway & Oxygen Therapy: Patient Spontanous Breathing and Patient connected to face mask oxygen  Post-op Assessment: Report given to RN and Post -op Vital signs reviewed and stable  Post vital signs: Reviewed and stable  Last Vitals:  Vitals:   03/14/17 1146 03/14/17 1201  BP: (!) 168/112 (!) 160/106  Pulse:    Resp:    Temp:    SpO2:      Last Pain:  Vitals:   03/14/17 1201  TempSrc:   PainSc: 7       Patients Stated Pain Goal: 2 (03/14/17 1201)  Complications: No apparent anesthesia complications

## 2017-03-14 NOTE — Anesthesia Procedure Notes (Signed)
Procedure Name: Intubation Date/Time: 03/14/2017 1:39 PM Performed by: Gwyndolyn Saxon, CRNA Pre-anesthesia Checklist: Patient identified, Emergency Drugs available, Suction available, Patient being monitored and Timeout performed Patient Re-evaluated:Patient Re-evaluated prior to induction Oxygen Delivery Method: Circle system utilized Preoxygenation: Pre-oxygenation with 100% oxygen Induction Type: IV induction Ventilation: Mask ventilation without difficulty Laryngoscope Size: Mac and 3 Grade View: Grade I Tube type: Oral Tube size: 7.5 mm Number of attempts: 1 Airway Equipment and Method: Stylet Placement Confirmation: ETT inserted through vocal cords under direct vision,  positive ETCO2 and CO2 detector Secured at: 23 cm Tube secured with: Tape Dental Injury: Teeth and Oropharynx as per pre-operative assessment  Comments: Intubation by Essie Hart

## 2017-03-14 NOTE — Progress Notes (Signed)
Bp 166/106 Dr Renold DonGermeroth informed
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
## 2017-03-14 NOTE — Anesthesia Preprocedure Evaluation (Signed)
Anesthesia Evaluation  Patient identified by MRN, date of birth, ID band Patient unresponsive    Reviewed: Allergy & Precautions, NPO status , Patient's Chart, lab work & pertinent test results, Unable to perform ROS - Chart review only  Airway Mallampati: Intubated       Dental   Pulmonary Current Smoker,    Pulmonary exam normal        Cardiovascular Normal cardiovascular exam     Neuro/Psych    GI/Hepatic   Endo/Other    Renal/GU      Musculoskeletal   Abdominal   Peds  Hematology   Anesthesia Other Findings   Reproductive/Obstetrics                             Anesthesia Physical  Anesthesia Plan  ASA: III  Anesthesia Plan: General   Post-op Pain Management:    Induction: Inhalational  PONV Risk Score and Plan: Treatment may vary due to age or medical condition  Airway Management Planned: Oral ETT  Additional Equipment:   Intra-op Plan:   Post-operative Plan: Post-operative intubation/ventilation  Informed Consent: I have reviewed the patients History and Physical, chart, labs and discussed the procedure including the risks, benefits and alternatives for the proposed anesthesia with the patient or authorized representative who has indicated his/her understanding and acceptance.   Dental advisory given  Plan Discussed with: CRNA and Surgeon  Anesthesia Plan Comments:         Anesthesia Quick Evaluation

## 2017-03-15 ENCOUNTER — Encounter (HOSPITAL_COMMUNITY): Payer: Self-pay | Admitting: Orthopedic Surgery

## 2017-03-15 NOTE — Progress Notes (Signed)
Please call patient with results. Thanks - pls call in doxy 100 mg po bid for 10 days
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
## 2017-03-15 NOTE — Anesthesia Postprocedure Evaluation (Signed)
Anesthesia Post Note  Patient: Alexander ShileyChristopher D Tardiff  Procedure(s) Performed: LEFT SHOULDER OPEN DEBRIDEMENT (Left )     Patient location during evaluation: PACU Anesthesia Type: General Level of consciousness: sedated and patient cooperative Pain management: pain level controlled Vital Signs Assessment: post-procedure vital signs reviewed and stable Respiratory status: spontaneous breathing Cardiovascular status: stable Anesthetic complications: no    Last Vitals:  Vitals:   03/14/17 1630 03/14/17 1700  BP: (!) 133/97 137/88  Pulse: 63 65  Resp: 17 16  Temp:    SpO2: 100% 100%    Last Pain:  Vitals:   03/14/17 1530  TempSrc:   PainSc: Asleep                 Lewie LoronJohn Caedence Snowden

## 2017-03-16 ENCOUNTER — Other Ambulatory Visit (INDEPENDENT_AMBULATORY_CARE_PROVIDER_SITE_OTHER): Payer: Self-pay

## 2017-03-16 MED ORDER — DOXYCYCLINE HYCLATE 100 MG PO TABS: 100.0000 mg | ORAL_TABLET | Freq: Two times a day (BID) | ORAL | 0 refills | Status: DC

## 2017-03-18 NOTE — Progress Notes (Signed)
Were antibiotics called in 3 days ago

## 2017-03-20 ENCOUNTER — Telehealth (INDEPENDENT_AMBULATORY_CARE_PROVIDER_SITE_OTHER): Payer: Self-pay

## 2017-03-20 LAB — AEROBIC/ANAEROBIC CULTURE W GRAM STAIN (SURGICAL/DEEP WOUND)

## 2017-03-20 LAB — AEROBIC/ANAEROBIC CULTURE (SURGICAL/DEEP WOUND)

## 2017-03-20 NOTE — Progress Notes (Signed)
thx

## 2017-03-20 NOTE — Telephone Encounter (Signed)
[facility] with critical value that patients tissue biopsy came back positive for staph.  Contact for SunTrustLeslie [phone]

## 2017-03-20 NOTE — Telephone Encounter (Signed)
We are aware thx

## 2017-03-22 ENCOUNTER — Encounter (INDEPENDENT_AMBULATORY_CARE_PROVIDER_SITE_OTHER): Payer: Self-pay | Admitting: Orthopedic Surgery

## 2017-03-22 ENCOUNTER — Ambulatory Visit (INDEPENDENT_AMBULATORY_CARE_PROVIDER_SITE_OTHER): Payer: Self-pay | Admitting: Orthopedic Surgery

## 2017-03-22 DIAGNOSIS — S41032D Puncture wound without foreign body of left shoulder, subsequent encounter: Principal | ICD-10-CM

## 2017-03-22 DIAGNOSIS — W3400XD Accidental discharge from unspecified firearms or gun, subsequent encounter: Secondary | ICD-10-CM

## 2017-03-22 DIAGNOSIS — S41002D Unspecified open wound of left shoulder, subsequent encounter: Secondary | ICD-10-CM

## 2017-03-26 ENCOUNTER — Encounter (INDEPENDENT_AMBULATORY_CARE_PROVIDER_SITE_OTHER): Payer: Self-pay | Admitting: Orthopedic Surgery
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
## 2017-03-29 ENCOUNTER — Ambulatory Visit (INDEPENDENT_AMBULATORY_CARE_PROVIDER_SITE_OTHER): Payer: Self-pay | Admitting: Orthopedic Surgery

## 2017-03-29 ENCOUNTER — Encounter (INDEPENDENT_AMBULATORY_CARE_PROVIDER_SITE_OTHER): Payer: Self-pay | Admitting: Orthopedic Surgery

## 2017-03-29 DIAGNOSIS — W3400XD Accidental discharge from unspecified firearms or gun, subsequent encounter: Secondary | ICD-10-CM

## 2017-03-29 DIAGNOSIS — S41032D Puncture wound without foreign body of left shoulder, subsequent encounter: Principal | ICD-10-CM

## 2017-03-29 DIAGNOSIS — S41002D Unspecified open wound of left shoulder, subsequent encounter: Secondary | ICD-10-CM
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
## 2017-04-26 ENCOUNTER — Ambulatory Visit (INDEPENDENT_AMBULATORY_CARE_PROVIDER_SITE_OTHER): Payer: Self-pay | Admitting: Orthopedic Surgery
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
## 2021-02-09 ENCOUNTER — Ambulatory Visit (HOSPITAL_COMMUNITY)
Admission: EM | Admit: 2021-02-09 | Discharge: 2021-02-09 | Disposition: A | Discharge status: Home / Self Care | Payer: Self-pay | Attending: Family Medicine | Admitting: Family Medicine

## 2021-02-09 ENCOUNTER — Encounter (HOSPITAL_COMMUNITY): Payer: Self-pay

## 2021-02-09 ENCOUNTER — Other Ambulatory Visit: Payer: Self-pay

## 2021-02-09 DIAGNOSIS — K0889 Other specified disorders of teeth and supporting structures: Secondary | ICD-10-CM

## 2021-02-09 MED ORDER — AMOXICILLIN 875 MG PO TABS: 875.0000 mg | ORAL_TABLET | Freq: Two times a day (BID) | ORAL | 0 refills | Status: AC

## 2021-02-09 MED ORDER — HYDROCODONE-ACETAMINOPHEN 5-325 MG PO TABS: 1.0000 | ORAL_TABLET | Freq: Four times a day (QID) | ORAL | 0 refills | Status: DC | PRN

## 2021-02-09 NOTE — Discharge Instructions (Signed)

## 2021-02-09 NOTE — ED Triage Notes (Signed)
Pt c/o rt lower toothache for over a month and treating with OTC meds. States pain is now keeping him up at night. States has a Pharmacist, community appt on Thursday.

## 2021-02-10 NOTE — ED Provider Notes (Signed)
[facility]   [id number] 02/09/21 Arrival Time: 1940  ASSESSMENT & PLAN:  1. Pain, dental    No sign of abscess requiring I&D at this time. Discussed.  Meds ordered this encounter  Medications   amoxicillin (AMOXIL) 875 MG tablet    Sig: Take 1 tablet (875 mg total) by mouth 2 (two) times daily for 10 days.    Dispense:  20 tablet    Refill:  0   HYDROcodone-acetaminophen (NORCO/VICODIN) 5-325 MG tablet    Sig: Take 1 tablet by mouth every 6 (six) hours as needed for moderate pain or severe pain.    Dispense:  8 tablet    Refill:  0   Has dentist appt later this week.  [place] Controlled Substances Registry consulted for this patient. I feel the risk/benefit ratio today is favorable for proceeding with this prescription for a controlled substance. Medication sedation precautions given.  Reviewed expectations re: course of current medical issues. Questions answered. Outlined signs and symptoms indicating need for more acute intervention. Patient verbalized understanding. After Visit Summary given.   SUBJECTIVE:  Alexander Yates is a 31 y.o. male who reports gradual onset of right lower rear dental pain described as aching/throbbing. Present for past month. Worse now; affecting daily activities. Fever: absent. Tolerating PO intake but reports pain with chewing. Normal swallowing. He does not see a dentist regularly. No neck swelling or pain. OTC analgesics without relief.   OBJECTIVE: Vitals:   02/09/21 2022  BP: (!) 148/92  Pulse: 69  Resp: 18  Temp: 98.5 F (36.9 C)  TempSrc: Oral  SpO2: 100%    General appearance: alert; no distress HENT: normocephalic; atraumatic; dentition: poor; right lower rea gum without areas of fluctuance, drainage, or bleeding and with tenderness to palpation; normal jaw movement without difficulty Neck: supple without LAD; FROM; trachea midline Lungs: normal respirations; unlabored; speaks full sentences without difficulty Skin:  warm and dry Psychological: alert and cooperative; normal mood and affect  No Known Allergies  Past Medical History:  Diagnosis Date   Asthma    Current smoker    Social History   Socioeconomic History   Marital status: Single    Spouse name: Not on file   Number of children: Not on file   Years of education: Not on file   Highest education level: Not on file  Occupational History   Not on file  Tobacco Use   Smoking status: Every Day    Packs/day: 0.50    Years: 14.00    Pack years: 7.00    Types: Cigarettes   Smokeless tobacco: Never  Vaping Use   Vaping Use: Never used  Substance and Sexual Activity   Alcohol use: Yes    Comment: weekly   Drug use: Yes    Types: Cocaine, Marijuana   Sexual activity: Yes  Other Topics Concern   Not on file  Social History Narrative   ** Merged History Encounter **       Social Determinants of Health   Financial Resource Strain: Not on file  Food Insecurity: Not on file  Transportation Needs: Not on file  Physical Activity: Not on file  Stress: Not on file  Social Connections: Not on file  Intimate Partner Violence: Not on file   History reviewed. No pertinent family history. Past Surgical History:  Procedure Laterality Date   DIRECT LARYNGOSCOPY N/A 01/04/2017   Procedure: DIRECT LARYNGOSCOPY AND ESOPHAGOSCOPY;  Surgeon: Osborn Coho, MD;  Location: [facility] OR;  Service:  ENT;  Laterality: N/A;   EYE SURGERY     I & D EXTREMITY Left 03/14/2017   Procedure: LEFT SHOULDER OPEN DEBRIDEMENT;  Surgeon: Cammy Copa, MD;  Location: [facility] OR;  Service: Orthopedics;  Laterality: Left;   TRACHEOSTOMY TUBE PLACEMENT N/A 01/04/2017   Procedure: TRACHEOSTOMY;  Surgeon: Osborn Coho, MD;  Location: [facility] OR;  Service: ENT;  Laterality: Vertis Kelch, MD 02/10/21 [phone]

## 2021-12-07 ENCOUNTER — Ambulatory Visit (HOSPITAL_COMMUNITY)
Admission: RE | Admit: 2021-12-07 | Discharge: 2021-12-07 | Disposition: A | Discharge status: Home / Self Care | Payer: Medicaid Other | Source: Ambulatory Visit | Attending: Internal Medicine | Admitting: Internal Medicine

## 2021-12-07 ENCOUNTER — Encounter (HOSPITAL_COMMUNITY): Payer: Self-pay

## 2021-12-07 VITALS — BP 199/140 | HR 70 | Temp 98.4°F | Resp 18

## 2021-12-07 DIAGNOSIS — R030 Elevated blood-pressure reading, without diagnosis of hypertension: Secondary | ICD-10-CM

## 2021-12-07 DIAGNOSIS — R051 Acute cough: Secondary | ICD-10-CM | POA: Diagnosis present

## 2021-12-07 DIAGNOSIS — Z1152 Encounter for screening for COVID-19: Secondary | ICD-10-CM | POA: Diagnosis not present

## 2021-12-07 DIAGNOSIS — F1010 Alcohol abuse, uncomplicated: Secondary | ICD-10-CM | POA: Diagnosis present

## 2021-12-07 NOTE — ED Triage Notes (Signed)
Pt has had a cough since Saturday he states he needs a note for work.
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
## 2021-12-07 NOTE — Discharge Instructions (Signed)
Please do blood pressure diaries and if you have 3 or more blood pressure readings more than 140/90 you need to be placed on medication to prevent kidney damage and end up on dialysis, can have a heart attack or stroke.  Work on quit drinking which will also cause your blood pressure to be high, when drinking more than 2 shots or 2 beers per day.  Keep record of your blood pressure If you develop chest pain or pressure, trouble with speech, go to the ER or call EMS to take you there.  Check you Mychart for your results, we will call you if positive

## 2021-12-08 LAB — SARS CORONAVIRUS 2 (TAT 6-24 HRS): SARS Coronavirus 2: NEGATIVE

## 2022-09-22 ENCOUNTER — Emergency Department (HOSPITAL_COMMUNITY): Payer: Medicaid Other

## 2022-09-22 ENCOUNTER — Emergency Department (HOSPITAL_COMMUNITY)
Admission: EM | Admit: 2022-09-22 | Discharge: 2022-09-23 | Disposition: A | Discharge status: Home / Self Care | Payer: Medicaid Other | Attending: Emergency Medicine | Admitting: Emergency Medicine

## 2022-09-22 ENCOUNTER — Encounter (HOSPITAL_COMMUNITY): Payer: Self-pay | Admitting: Emergency Medicine

## 2022-09-22 ENCOUNTER — Other Ambulatory Visit: Payer: Self-pay

## 2022-09-22 DIAGNOSIS — M25521 Pain in right elbow: Secondary | ICD-10-CM | POA: Diagnosis present

## 2022-09-22 DIAGNOSIS — M79645 Pain in left finger(s): Secondary | ICD-10-CM | POA: Diagnosis not present

## 2022-09-22 MED ORDER — IBUPROFEN 400 MG PO TABS
400.0000 mg | ORAL_TABLET | Freq: Once | ORAL | Status: AC | PRN
  Administered 2022-09-22: 400 mg via ORAL
  Filled 2022-09-22: qty 1
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
## 2022-09-23 MED ORDER — LIDOCAINE 5 % EX PTCH: 1.0000 | MEDICATED_PATCH | CUTANEOUS | 0 refills | Status: AC

## 2022-09-23 MED ORDER — KETOROLAC TROMETHAMINE 60 MG/2ML IM SOLN
60.0000 mg | Freq: Once | INTRAMUSCULAR | Status: AC
  Administered 2022-09-23: 60 mg via INTRAMUSCULAR
  Filled 2022-09-23: qty 2

## 2022-09-23 MED ORDER — LIDOCAINE 5 % EX PTCH
1.0000 | MEDICATED_PATCH | CUTANEOUS | Status: DC
  Administered 2022-09-23: 1 via TRANSDERMAL
  Filled 2022-09-23: qty 1

## 2022-09-23 MED ORDER — MELOXICAM 15 MG PO TABS: 15.0000 mg | ORAL_TABLET | Freq: Every day | ORAL | 0 refills | Status: DC
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
## 2022-09-28 ENCOUNTER — Ambulatory Visit (HOSPITAL_COMMUNITY)
Admission: EM | Admit: 2022-09-28 | Discharge: 2022-09-28 | Disposition: A | Discharge status: Home / Self Care | Payer: Medicaid Other | Attending: Sports Medicine | Admitting: Sports Medicine

## 2022-09-28 ENCOUNTER — Encounter (HOSPITAL_COMMUNITY): Payer: Self-pay | Admitting: *Deleted

## 2022-09-28 ENCOUNTER — Other Ambulatory Visit: Payer: Self-pay

## 2022-09-28 DIAGNOSIS — M25521 Pain in right elbow: Secondary | ICD-10-CM | POA: Diagnosis not present

## 2022-09-28 MED ORDER — MELOXICAM 7.5 MG PO TABS: 15.0000 mg | ORAL_TABLET | Freq: Every day | ORAL | 0 refills | Status: AC
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
## 2022-09-28 NOTE — Discharge Instructions (Signed)
You were seen for right elbow pain. Your x-rays don't show any fractures and the ultrasound we did shows the triceps tendon is intact. I think this is a soft tissue injury and will improve with time, rest, compression, icing, and anti-inflammatory medication. I have sent you Meloxicam 15mg  to take once daily (avoid taking with other NSAIDs such as ibuprofen or naproxen). Continue as needed tylenol but increase to 1000mg  8 hours apart (avoid with alcohol). Ice as needed for pain. If this isn't improving over the next 2 weeks I recommend orthopedic follow-up.

## 2023-07-31 ENCOUNTER — Ambulatory Visit (HOSPITAL_COMMUNITY)
Admission: EM | Admit: 2023-07-31 | Discharge: 2023-07-31 | Disposition: A | Discharge status: Home / Self Care | Attending: Emergency Medicine | Admitting: Emergency Medicine

## 2023-07-31 ENCOUNTER — Encounter (HOSPITAL_COMMUNITY): Payer: Self-pay | Admitting: *Deleted

## 2023-07-31 ENCOUNTER — Ambulatory Visit (HOSPITAL_COMMUNITY): Payer: Self-pay

## 2023-07-31 DIAGNOSIS — Z758 Other problems related to medical facilities and other health care: Secondary | ICD-10-CM

## 2023-07-31 DIAGNOSIS — R030 Elevated blood-pressure reading, without diagnosis of hypertension: Secondary | ICD-10-CM

## 2023-07-31 DIAGNOSIS — R319 Hematuria, unspecified: Secondary | ICD-10-CM | POA: Diagnosis not present

## 2023-07-31 DIAGNOSIS — F17200 Nicotine dependence, unspecified, uncomplicated: Secondary | ICD-10-CM

## 2023-07-31 LAB — POCT URINALYSIS DIP (MANUAL ENTRY)
Bilirubin, UA: NEGATIVE
Glucose, UA: NEGATIVE mg/dL
Ketones, POC UA: NEGATIVE mg/dL
Nitrite, UA: NEGATIVE
Protein Ur, POC: NEGATIVE mg/dL
Spec Grav, UA: 1.02 (ref 1.010–1.025)
Urobilinogen, UA: 0.2 U/dL
pH, UA: 6 (ref 5.0–8.0)

## 2023-07-31 NOTE — Discharge Instructions (Addendum)
 Avoid drinking,smoking, drug use- as it can make issues worse Please get established with PCP-call for appt, need recheck of blood pressure as it was elevated at today's visit Your urine shows trace of blood/leukocytes in urine-please drink plenty of water, avoid caffeine If you have new or worsening issues(abdominal pain, fever, worsening symptoms go to Er for further evaluation)
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
## 2023-07-31 NOTE — ED Triage Notes (Signed)
 Pt states he seen blood in his urine on Friday and once since. He hasn't taken any meds. Denies any dysuria or discharge.

## 2023-10-09 ENCOUNTER — Ambulatory Visit (HOSPITAL_COMMUNITY): Payer: Self-pay
